# Patient Record
Sex: Male | Born: 1955
Health system: Southern US, Community
[De-identification: ages and names within clinical notes are randomized; demographics above are authoritative.]

## PROBLEM LIST (undated history)

## (undated) DIAGNOSIS — J45909 Unspecified asthma, uncomplicated: Secondary | ICD-10-CM

## (undated) DIAGNOSIS — K5790 Diverticulosis of intestine, part unspecified, without perforation or abscess without bleeding: Secondary | ICD-10-CM

## (undated) DIAGNOSIS — I10 Essential (primary) hypertension: Secondary | ICD-10-CM

## (undated) DIAGNOSIS — E7800 Pure hypercholesterolemia, unspecified: Secondary | ICD-10-CM

## (undated) DIAGNOSIS — F988 Other specified behavioral and emotional disorders with onset usually occurring in childhood and adolescence: Secondary | ICD-10-CM

## (undated) HISTORY — PX: EYE SURGERY: SHX253

---

## 2007-08-15 ENCOUNTER — Encounter: Admission: RE | Admit: 2007-08-15 | Discharge: 2007-08-15 | Payer: Self-pay | Admitting: Otolaryngology

## 2007-10-09 ENCOUNTER — Encounter: Admission: RE | Admit: 2007-10-09 | Discharge: 2007-10-09 | Payer: Self-pay | Admitting: Otolaryngology

## 2007-10-16 ENCOUNTER — Encounter (INDEPENDENT_AMBULATORY_CARE_PROVIDER_SITE_OTHER): Payer: Self-pay | Admitting: Otolaryngology

## 2007-10-16 ENCOUNTER — Ambulatory Visit (HOSPITAL_BASED_OUTPATIENT_CLINIC_OR_DEPARTMENT_OTHER): Admission: RE | Admit: 2007-10-16 | Discharge: 2007-10-16 | Payer: Self-pay | Admitting: Otolaryngology

## 2009-03-07 IMAGING — CR DG CHEST 2V
3 series · 3 of 3 positions shown · non-contrast
Comparison: 08/15/07.

CLINICAL DATA: Pre-op.  Productive cough for several weeks. 
 CHEST ? 2 VIEW:

[view not recorded (1 of 3)]
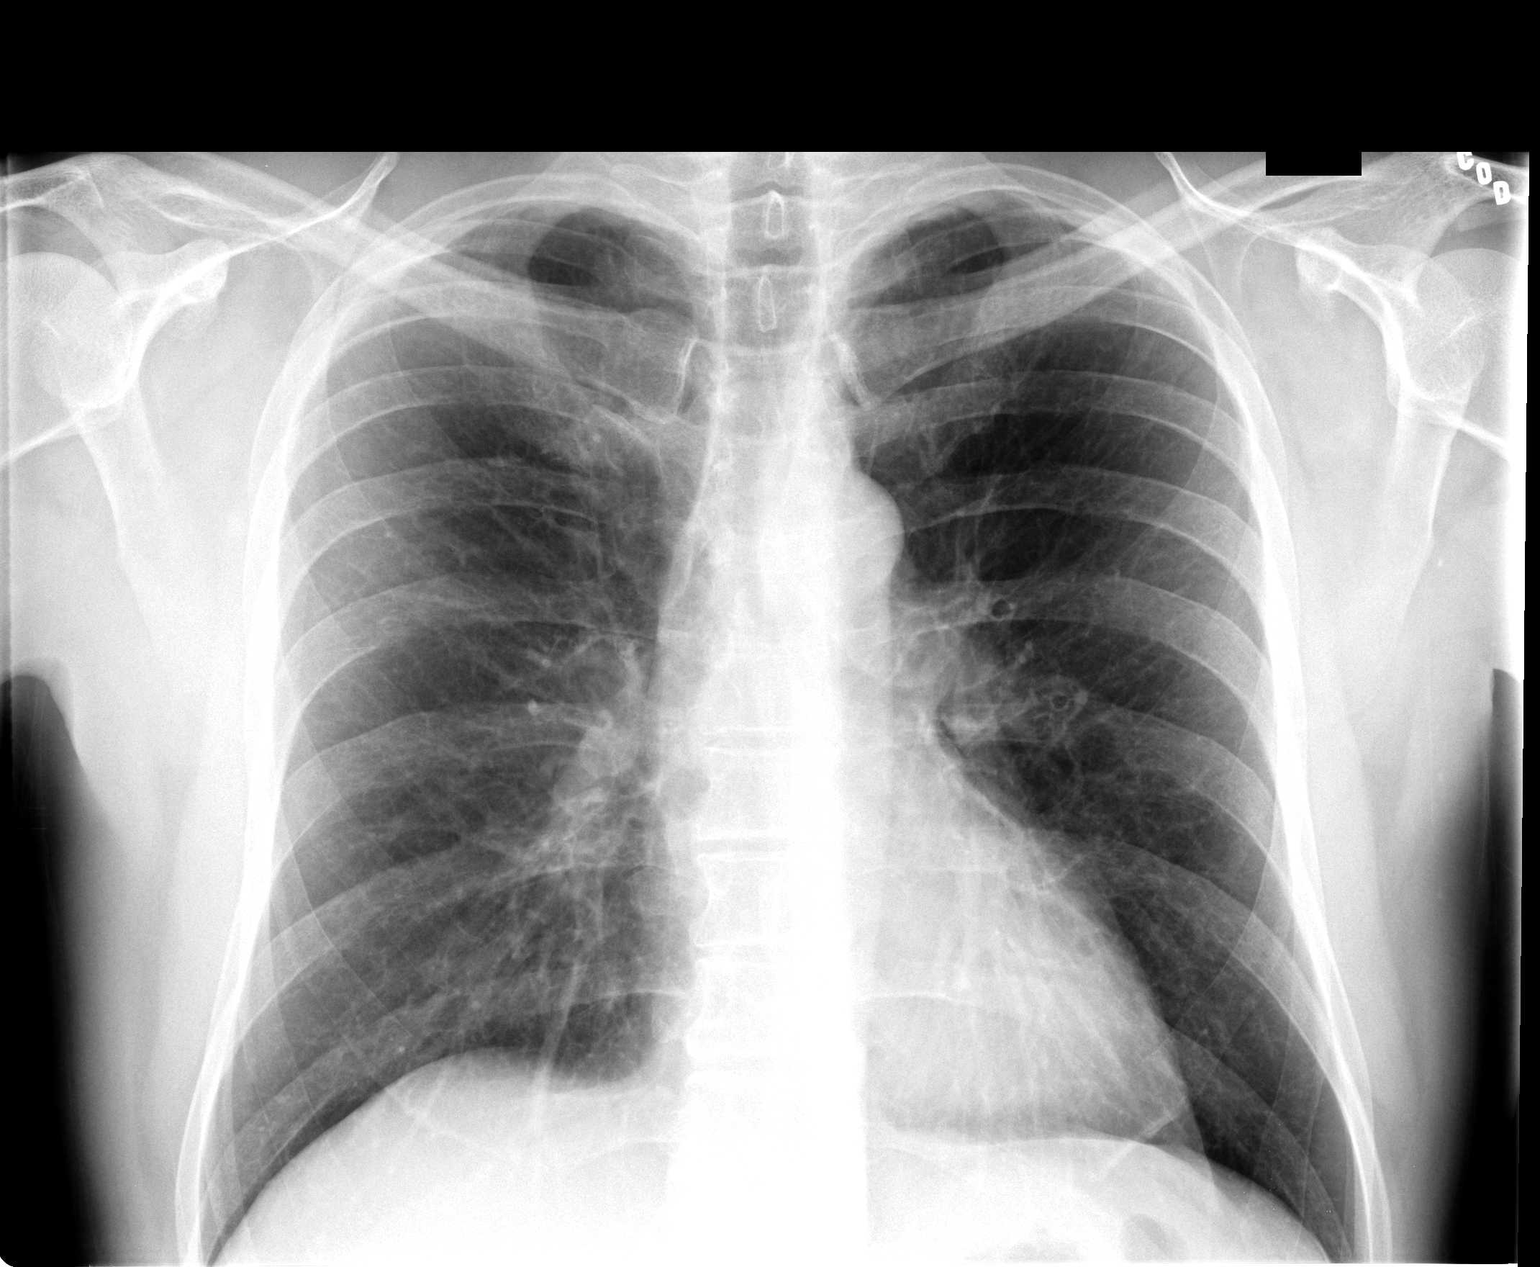

[view not recorded (2 of 3)]
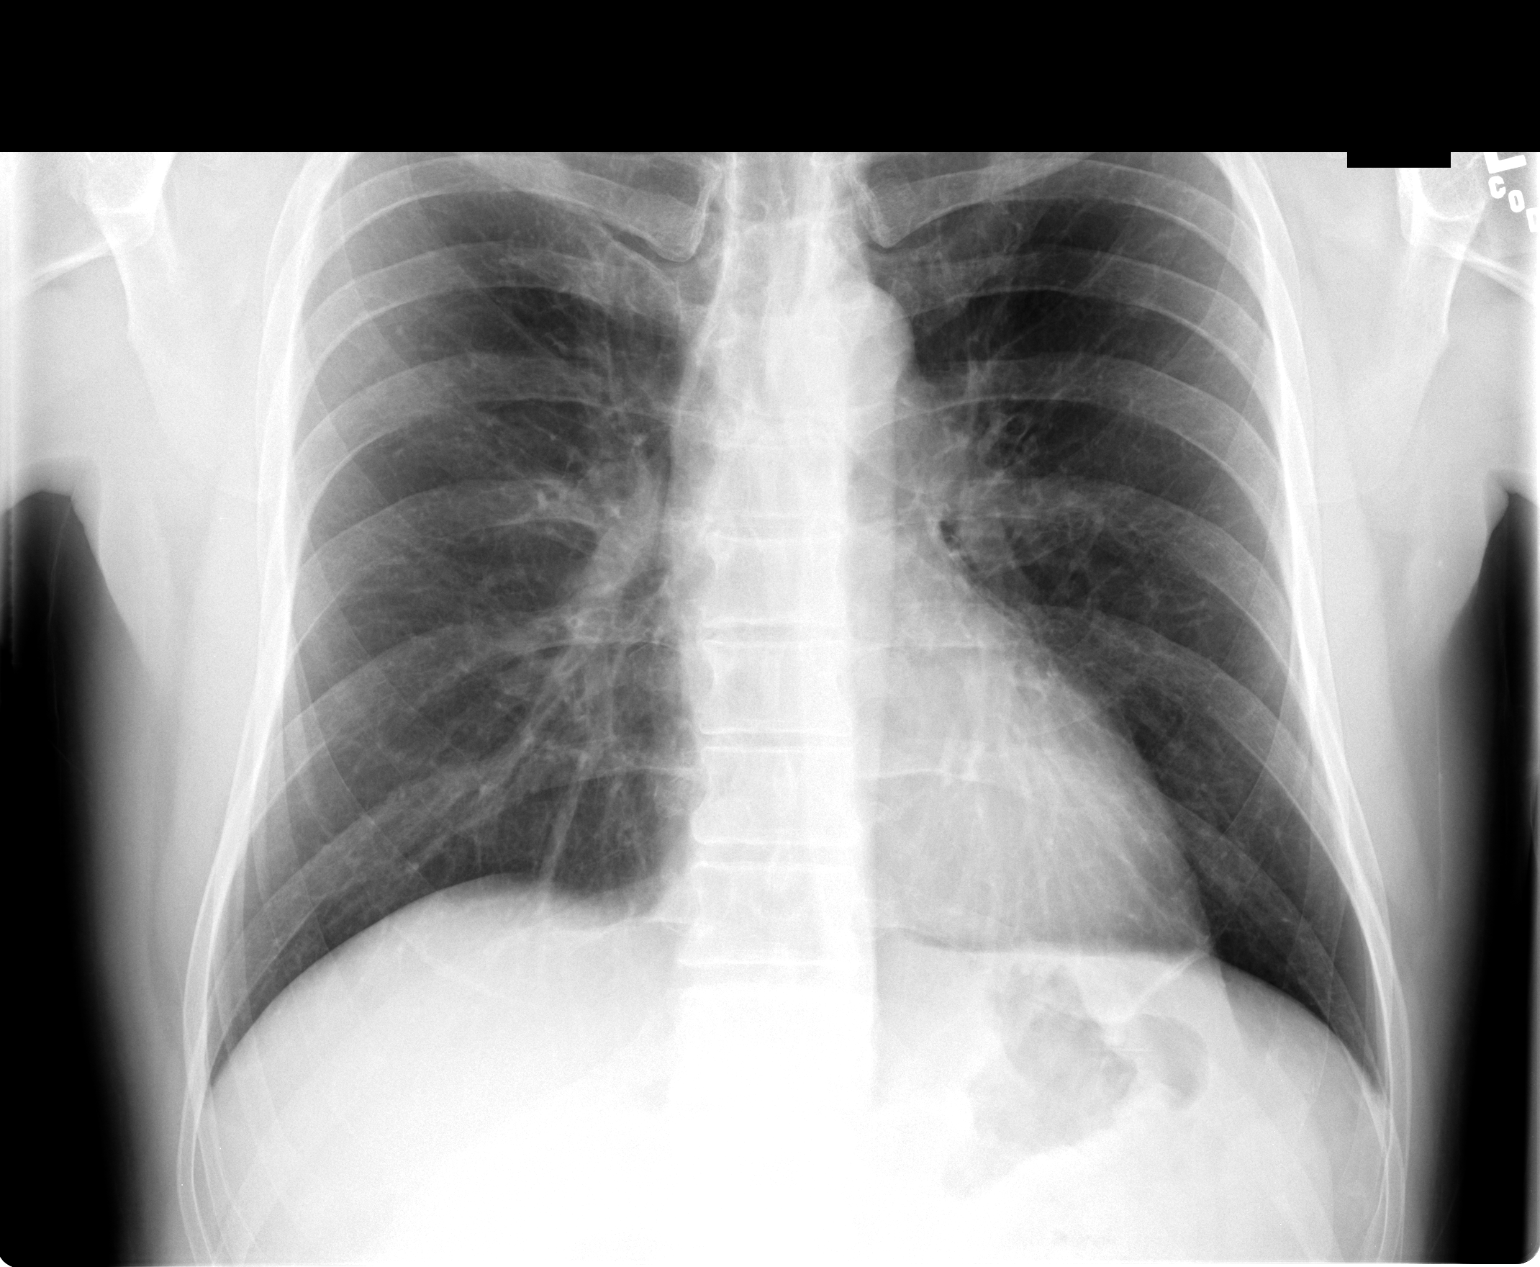

[view not recorded (3 of 3)]
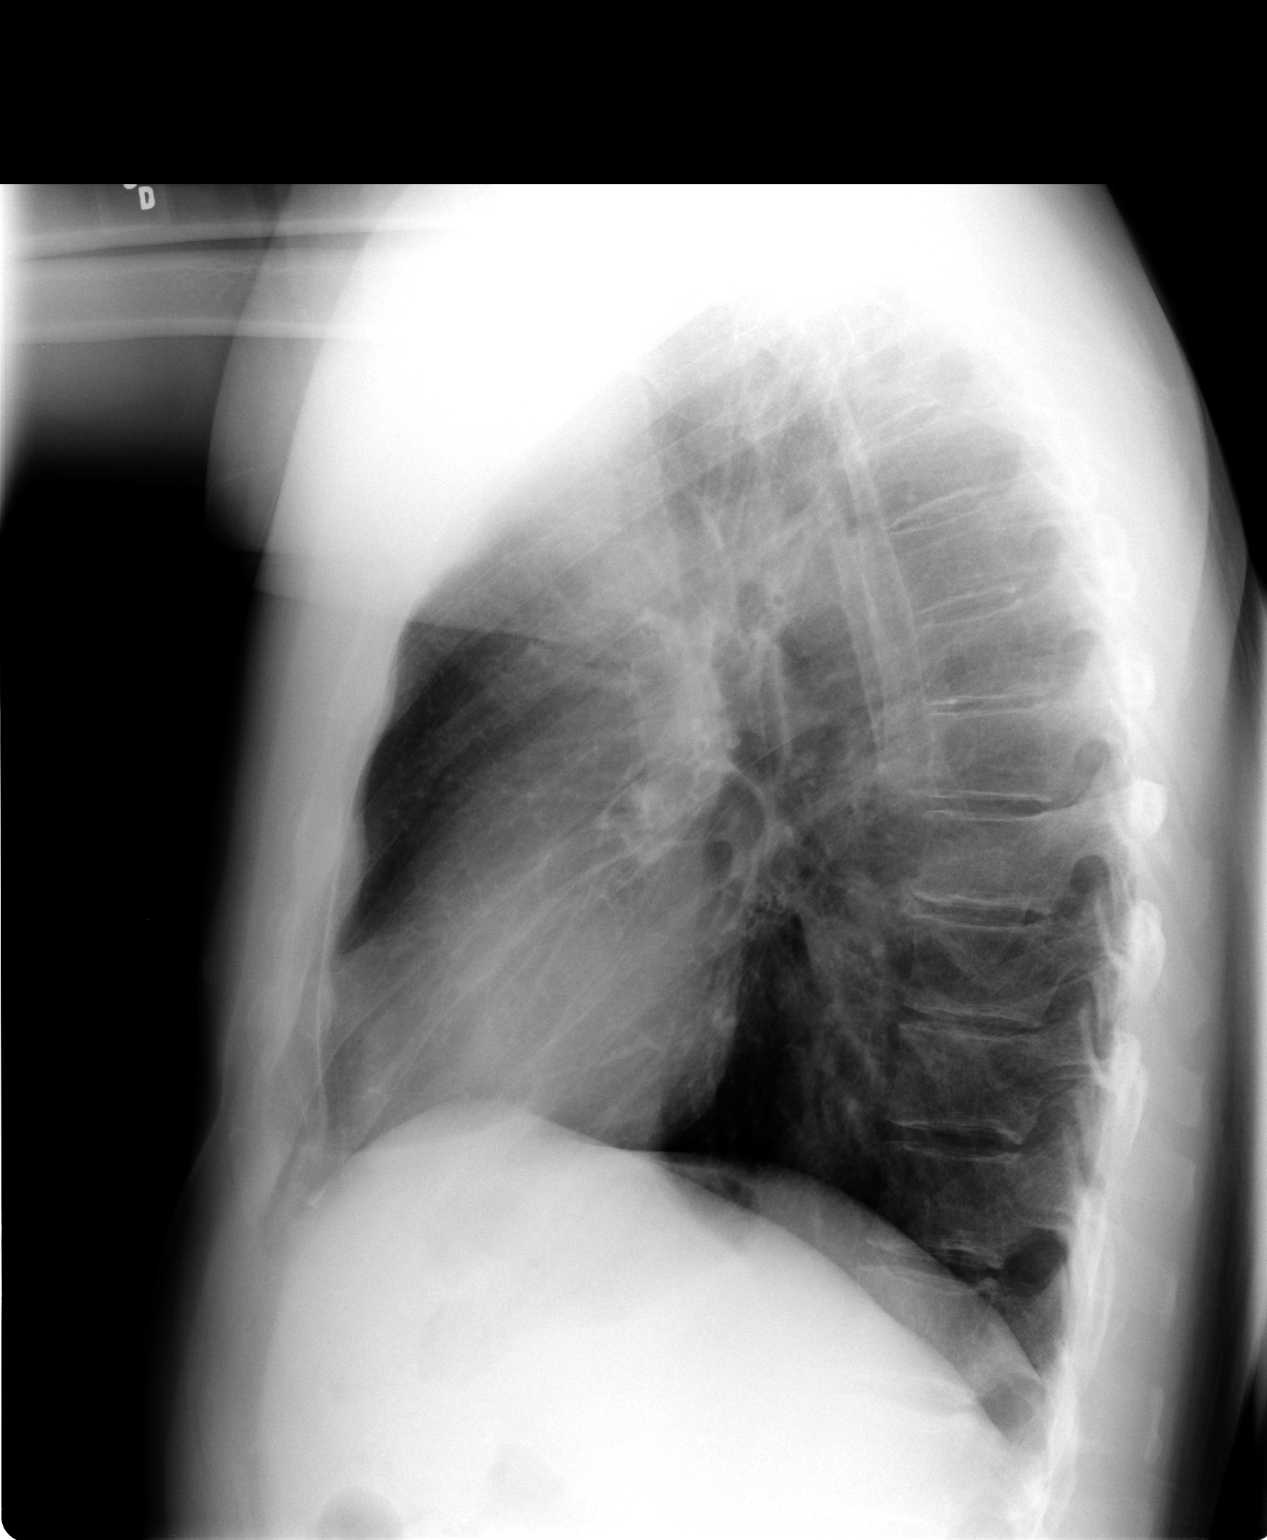

[3 of 3 positions shown; findings below may reference images not displayed]

FINDINGS: Trachea is midline.  Heart size stable.  Lungs are clear.  No pleural fluid.
IMPRESSION: No acute findings.

## 2010-07-01 ENCOUNTER — Ambulatory Visit: Payer: Self-pay | Admitting: Cardiology

## 2010-07-01 LAB — CONVERTED CEMR LAB
AST: 29 units/L (ref 0–37)
Albumin: 4.2 g/dL (ref 3.5–5.2)
Alkaline Phosphatase: 50 units/L (ref 39–117)
BUN: 27 mg/dL — ABNORMAL HIGH (ref 6–23)
Basophils Absolute: 0 10*3/uL (ref 0.0–0.1)
Calcium: 9.5 mg/dL (ref 8.4–10.5)
Chloride: 101 meq/L (ref 96–112)
Cholesterol: 137 mg/dL (ref 0–200)
Eosinophils Relative: 6.8 % — ABNORMAL HIGH (ref 0.0–5.0)
HCT: 39.5 % (ref 39.0–52.0)
HDL: 35.9 mg/dL — ABNORMAL LOW (ref >39.00)
Hemoglobin: 13.7 g/dL (ref 13.0–17.0)
Lymphocytes Relative: 16.9 % (ref 12.0–46.0)
Lymphs Abs: 1.2 10*3/uL (ref 0.7–4.0)
MCV: 90.3 fL (ref 78.0–100.0)
Monocytes Relative: 7.9 % (ref 3.0–12.0)
Neutro Abs: 4.6 10*3/uL (ref 1.4–7.7)
Potassium: 3.9 meq/L (ref 3.5–5.1)
RBC: 4.38 M/uL (ref 4.22–5.81)
Sodium: 140 meq/L (ref 135–145)
Total Bilirubin: 0.9 mg/dL (ref 0.3–1.2)
Total Protein: 6.4 g/dL (ref 6.0–8.3)
Triglycerides: 186 mg/dL — ABNORMAL HIGH (ref 0.0–149.0)
VLDL: 37.2 mg/dL (ref 0.0–40.0)

## 2010-07-02 ENCOUNTER — Ambulatory Visit: Payer: Self-pay | Admitting: Cardiology

## 2010-07-02 DIAGNOSIS — R0602 Shortness of breath: Secondary | ICD-10-CM | POA: Insufficient documentation

## 2010-07-02 DIAGNOSIS — E785 Hyperlipidemia, unspecified: Secondary | ICD-10-CM | POA: Insufficient documentation

## 2010-07-02 DIAGNOSIS — I10 Essential (primary) hypertension: Secondary | ICD-10-CM | POA: Insufficient documentation

## 2010-07-02 LAB — CONVERTED CEMR LAB: TSH: 0.88 microintl units/mL (ref 0.35–5.50)

## 2010-07-08 ENCOUNTER — Telehealth (INDEPENDENT_AMBULATORY_CARE_PROVIDER_SITE_OTHER): Payer: Self-pay | Admitting: *Deleted

## 2010-07-09 ENCOUNTER — Ambulatory Visit: Payer: Self-pay | Admitting: Internal Medicine

## 2010-07-09 ENCOUNTER — Encounter: Payer: Self-pay | Admitting: Internal Medicine

## 2010-07-09 ENCOUNTER — Ambulatory Visit: Payer: Self-pay

## 2010-07-09 ENCOUNTER — Encounter (HOSPITAL_COMMUNITY): Admission: RE | Admit: 2010-07-09 | Discharge: 2010-08-11 | Payer: Self-pay | Admitting: Cardiology

## 2010-12-24 NOTE — Assessment & Plan Note (Signed)
Summary: Cardiology Nuclear Testing  Nuclear Med Background Indications for Stress Test: Evaluation for Ischemia   History: Myocardial Perfusion Study  History Comments:  ~15 yrs ago MPS:OK per patient  Symptoms: Chest Pressure with Exertion, Diaphoresis, Dizziness, DOE, Fatigue, Light-Headedness, Palpitations  Symptoms Comments: Last episode of CP:1 week ago.   Nuclear Pre-Procedure Cardiac Risk Factors: Hypertension, Lipids Caffeine/Decaff Intake: None NPO After: 6:30 PM Lungs: Clear IV 0.9% NS with Angio Cath: 20g     IV Site: (R) AC IV Started by: Irean Hong RN Chest Size (in) 42     Height (in): 71 Weight (lb): 192 BMI: 26.88  Nuclear Med Study 1 or 2 day study:  1 day     Stress Test Type:  Stress Reading MD:  Arvilla Meres, MD     Referring MD:  Charlies Constable, MD Resting Radionuclide:  Technetium 24m Tetrofosmin     Resting Radionuclide Dose:  11 mCi  Stress Radionuclide:  Technetium 39m Tetrofosmin     Stress Radionuclide Dose:  33 mCi   Stress Protocol Exercise Time (min):  11:00 min     Max HR:  146 bpm     Predicted Max HR:  168 bpm  Max Systolic BP: 175 mm Hg     Percent Max HR:  86.90 %     METS: 13.4 Rate Pressure Product:  16109    Stress Test Technologist:  [employer] CMA-N     Nuclear Technologist:  Domenic Polite CNMT  Rest Procedure  Myocardial perfusion imaging was performed at rest 45 minutes following the intravenous administration of Myoview Technetium 11m Tetrofosmin.  Stress Procedure  The patient exercised for eleven minutes.  The patient stopped due to fatigue and denied any chest pain.  There were no significant ST-T wave changes, only occasional fusion beats, PVC's and PAC's.  Myoview was injected at peak exercise and myocardial perfusion imaging was performed after a brief delay.  QPS Raw Data Images:  Normal; no motion artifact; normal heart/lung ratio. Stress Images:  There is normal uptake in all areas. Rest Images:  Normal  homogeneous uptake in all areas of the myocardium. Subtraction (SDS):  Normal Transient Ischemic Dilatation:  .95  (Normal <1.22)  Lung/Heart Ratio:  .32  (Normal <0.45)  Quantitative Gated Spect Images QGS EDV:  94 ml QGS ESV:  38 ml QGS EF:  60 % QGS cine images:  Normal  Findings Normal nuclear study      Overall Impression  Exercise Capacity: Excellent exercise capacity. BP Response: Normal blood pressure response. Clinical Symptoms: Dyspnea ECG Impression: No significant ST segment change suggestive of ischemia. Overall Impression: Normal stress nuclear study.  Appended Document: Cardiology Nuclear Testing I called. BB

## 2010-12-24 NOTE — Assessment & Plan Note (Signed)
Summary: np6   Visit Type:  Initial Consult Primary Provider:  [facility]   History of Present Illness: Jimmy Santos is 55 years old and came in today for a new patient evaluation because of shortness of breath. These symptoms have been going on for about 3 months. He notices the shortness of breath mostly with activity and it is associated with some back and mild chest discomfort. After he finishes the activity he has a profound sense of fatigue. Most of his activities that cause the symptoms are tenderness.  His risk profile for vascular disease include hypertension and hyperlipidemia.  He also has a history of sinus infections and has had previous sinus surgery. He wondered if some of his symptoms could be related to asthma although he has had no recent wheezing.  Current Medications (verified): 1)  Lipitor 10 Mg Tabs (Atorvastatin Calcium) .Marland Kitchen.. 1 By Mouth Daily 2)  Adderall 10 Mg Tabs (Amphetamine-Dextroamphetamine) .... 2 By Mouth Two Times A Day 3)  Quinapril-Hydrochlorothiazide 20-25 Mg Tabs (Quinapril-Hydrochlorothiazide) .Marland Kitchen.. 1 By Mouth Daily 4)  Multivitamins   Tabs (Multiple Vitamin) .Marland Kitchen.. 1 By Mouth Daily  Allergies (verified): No Known Drug Allergies  Past History:  Past Medical History: Sinus surgery Hypertension Hyperlipidemia  Family History: Father has hypertension and is alive. Mother has no history of heart disease.  Social History: He is married and has one son who graduated from [employer] and is working with [employer]. 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 He does not smoke  Review of Systems       ROS is negative except as outlined in HPI.   Vital Signs:  Patient profile:   55 year old male Height:      71 inches Weight:      194 pounds BMI:     27.16 Pulse rate:   63 / minute BP sitting:   140 / 80  (left arm) Cuff size:   regular  Vitals Entered By: Burnett Kanaris, CNA (July 02, 2010 12:50 PM)  Physical Exam  Additional  Exam:  Gen. Well-nourished, in no distress Head: Normocephalic    Eyes: PERRLA/EOM intact; conjunctiva and lids normal Neck: No JVD, thyroid not enlarged, no carotid bruits Lungs: No tachypnea, clear w/o rales, rhonchi or wheezes CV: Rhythm regular, PMI not displaced,  sounds  nl, no murmurs or gallops, no edema, pulses nl UE and LE Abd: BS nl, abd soft and non-tender w/o masses or hepatosplenomegaly MS: No deformities Neuro: no focal sns Skin: no lesions Psych: nl affect    Impression & Recommendations:  Problem # 1:  SHORTNESS OF BREATH (ICD-786.05)  The etiology of his shortness of breath is not clear. He has no history of lung disease and has not been a smoker. He does have some associated chest pain and he does have a moderate risk profile for vascular disease. I'm concerned his symptoms could be due to ischemia. His left card and today is normal with one PVC. We will plan to get a chest x-ray and plan to schedule a rest stress exercise Myoview scan.  His updated medication list for this problem includes:    Quinapril-hydrochlorothiazide 20-25 Mg Tabs (Quinapril-hydrochlorothiazide) .Marland Kitchen... 1 by mouth daily  Orders: EKG w/ Interpretation (93000) Nuclear Stress Test (Nuc Stress Test) T-2 View CXR (71020TC)  Problem # 2:  HYPERTENSION, BENIGN (ICD-401.1) This appears well controlled on his current medications. He indicated his blood pressures at home her lower than what was measured today. His  updated medication list for this problem includes:    Quinapril-hydrochlorothiazide 20-25 Mg Tabs (Quinapril-hydrochlorothiazide) .Marland Kitchen... 1 by mouth daily  Problem # 3:  HYPERLIPIDEMIA-MIXED (ICD-272.4) This appears fairly well controlled on current medications although his HDL is slightly low. His updated medication list for this problem includes:    Lipitor 10 Mg Tabs (Atorvastatin calcium) .Marland Kitchen... 1 by mouth daily  Patient Instructions: 1)  A chest x-ray takes a picture of the organs and  structures inside the chest, including the heart, lungs, and blood vessels. This test can show several things, including, whether the heart is enlarged; whether fluid is building up in the lungs; and whether pacemaker / defibrillator leads are still in place. 2)  Your physician has requested that you have an exercise stress myoview.  For further information please visit [email or web address]  Please follow instruction sheet, as given. 3)  We will see you back on an as needed basis.

## 2010-12-24 NOTE — Progress Notes (Signed)
Summary: Nuclear pre procedure  Phone Note Outgoing Call Call back at [facility] Phone [phone]   Call placed by: Rea [employer], CMA,  July 08, 2010 5:03 PM Call placed to: Patient Summary of Call: Annice Pih left message with information on Myoview Information Sheet (see scanned document for details).      Nuclear Med Background Indications for Stress Test: Evaluation for Ischemia     Symptoms: Chest Pain, DOE, SOB    Nuclear Pre-Procedure Cardiac Risk Factors: Hypertension, Lipids Height (in): 71

## 2011-04-06 NOTE — Op Note (Signed)
NAME:  Jimmy Santos, Jimmy Santos             ACCOUNT NO.:  [id number]   MEDICAL RECORD NO.:  [id number]          PATIENT TYPE:  AMB   LOCATION:  DSC                          FACILITY:  [facility]   PHYSICIAN:  Kinnie Scales. Annalee Genta, M.D.DATE OF BIRTH:  12-Jan-1956   DATE OF PROCEDURE:  10/16/2007  DATE OF DISCHARGE:                               OPERATIVE REPORT   PREOPERATIVE DIAGNOSES:  1. Chronic sinusitis.  2. Sinonasal polyps.  3. Progressive reactive airway disease.  4. Nasal septal deviation with airway obstruction.  5. Inferior turbinate hypertrophy.   POSTOPERATIVE DIAGNOSIS/INDICATIONS FOR PROCEDURE:  1. Chronic sinusitis.  2. Sinonasal polyps.  3. Progressive reactive airway disease.  4. Nasal septal deviation with airway obstruction.  5. Inferior turbinate hypertrophy.   SURGICAL PROCEDURES:  1. Bilateral endoscopic sinus surgery with computer-assisted      navigation (InstaTrak consisting of bilateral total ethmoidectomy,      bilateral maxillary antrostomy with removal of diseased tissue and      bilateral nasal frontal recess exploration).  2. Nasal septoplasty.  3. Bilateral inferior turbinate reduction.   ANESTHESIA:  General endotracheal.   SURGEON:  Dr. Annalee Genta.   COMPLICATIONS:  None.   ESTIMATED BLOOD LOSS:  Approximately 300 mL.   The patient transferred from the operating room to the recovery room in  stable condition.   BRIEF HISTORY:  Jimmy Santos is a 55 year old white male whose been  followed by Dr. Flo Shanks at [facility] with a history of  progressive chronic sinusitis. The patient has been treated with  multiple rounds of long-term high potency antibiotic therapy and despite  this aggressive treatment, the patient has continued to have chronic and  recurrent sinusitis.  He has had progression in his reactive airway  disease with difficulty managing his asthmatic flares.  The patient  complains of chronic and progressive nasal airway congestion  and  obstruction, bilateral facial pressure and chronic postnasal discharge  with cough. Despite appropriate medical therapy, the patient has  continued to have ongoing and worsening symptoms.  CT scanning performed  as part of the evaluation and workup showed significant polypoid disease  involving the ethmoid, maxillary and frontal sinuses.  The patient also  had a severely deviated nasal septum and turbinate hypertrophy.  Given  the patient's history, examination and physical findings, he was  scheduled for the above surgical procedures. Prior to surgery, a CT scan  was obtained with InstaTrak formatting for intraoperative computer-  assisted navigation.  The patient was also treated with a prolonged  course of combined antibiotic therapy and oral steroids and despite this  treatment continued to have ongoing problems. Given his history and  examination, he was counseled regarding the risks, benefits and possible  complications of the above surgical procedures.  The patient and his  wife understood and concurred with our plan for surgery which was  scheduled as an outpatient under general anesthesia.  Unfortunately, Dr.  Lazarus Salines was injured prior to the scheduled surgery and was unable to  perform the surgery. At the patient's request, Dr. Osborn Coho was  able to perform the surgical procedure under  general anesthesia at [facility]. Again, prior to surgery, the risks,  benefits and possible complications of each of the surgical procedures  were discussed in detail and the patient and his wife understood and  concurred with our plan for surgery which was scheduled as above.   DESCRIPTION OF PROCEDURE:  The patient was brought to the operating room  on October 16, 2007 and placed in a supine position on the operating  table.  General endotracheal anesthesia was established. When the  patient was adequately anesthetized, the patient's nasal cavity was   injected with a total of 9 mL of 1% lidocaine 1:100,000 solution  epinephrine which was injected in a submucosal fashion along the middle  turbinate, uncinate process, lateral nasal wall, inferior turbinate and  nasal septum.  The patient was also injected via transoral  sphenopalatine block. A total of 9 mL of local was used. The patient's  nose was then packed with Afrin-soaked cottonoid pledgets left in place  for approximately 10 minutes to allow for vasoconstriction and  hemostasis.  The InstaTrak computer navigation headgear was applied,  anatomic and surgical landmarks were identified and confirmed. This  device was used throughout the surgical procedure for intraoperative  computer-assisted navigation. The patient was positioned on the  operating table, prepped and draped in a sterile fashion.   The patient's nasal cavity was examined using a zero-degree telescope  and the surgical procedure was begun on the left-hand side  after  removal of packing. Using a zero-degree telescope and the straight  microdebrider, the patient's nasal cavities cleared of minimal polypoid  disease within the middle meatus.  The middle turbinate was gently  medialized and the uncinate process reflected anteriorly and resected  with a through cutting forceps.  The entire uncinate process then  resected and dissection was carried out through the ethmoid bulla and  dissected from anterior to posterior through the ethmoid cavity.  The  patient had significant polypoid disease in the left ethmoid region and  this was resected using the straight microdebrider. The posterior  ethmoid air cells were identified and location was confirmed using the  InstaTrak device. Using a 45 degree telescope and a curved  microdebrider, dissection was carried out from posterior to anterior  along the roof of the ethmoid removing polypoid disease and bony  septations. The patient had a very large underlying anterior ethmoid  air  cell with significant polypoid inflammation. This was resected and  dissection was then carried out into the nasofrontal recess. The patient  was found to have a tripartite nasofrontal recess and the individual  septations were resected.  Thick mucopurulent material was aspirated  from the frontal sinus creating a widely patent nasofrontal recess.  Attention was then turned to the lateral nasal wall where the natural  ostia of the maxillary sinus was identified.  This completely occluded  with polypoid disease which was resected. Using a 45 degree telescope,  the natural ostium was enlarged in an anterior, inferior and posterior  direction.  A large posterior accessory ostium was identified and the  intervening soft tissue was resected. Within the left maxillary sinus,  there was significant mucinous inflammation and polypoid mucosa which  was resected.  The entire sinus was cleared of infected material and the  sinus ostium was patent at the conclusion of procedure.   Attention was then turned to the patient's right-hand side where a  similar procedure was carried out. The middle turbinate was  gently  medialized.  The uncinate process was resected and a zero-degree  telescope with a microdebrider was used to clear the inferior aspects of  the ethmoid sinus of disease. The posterior superior ethmoid was  identified and this was confirmed with the InstaTrak. Dissection was  then carried along the roof of the ethmoid sinus removing bony  septations and polypoid mucosa with a 45 degree telescope and a curved  microdebrider.  Again the nasofrontal recess was completely occluded  with polypoid disease and this was cleared using the 60 degree  microdebrider and a 45 degree telescope for visualization with the  InstaTrak guidance. Attention was then to the turned lateral nasal wall  where the natural ostium was identified and enlarged again there was  thick mucinous debris within the  maxillary sinus and a culture was taken  and sent to pathology for evaluation of allergic fungal mucin. Culture  and sensitivity was taken from the left maxillary sinus, allergic fungal  mucin from the right maxillary sinus. The patient's nasal cavity was  then cleared of debris and suctioned.  Attention was then turned to the  nasal septum where nasal septoplasty was performed. A right anterior  hemitransfixion incision was created and a mucoperichondrial flap was  elevated from anterior to posterior. On the patient's right-hand side,  there was a large right-sided bony septal spur which was mobilized with  a 4-mm osteotome and resected. The bony cartilaginous junction was then  crossed in the midline and mucoperiosteal flap was elevated along the  left-hand side.  Deviated bone and cartilage was resected, anterior,  dorsal and columellar cartilage was not deviated and was left intact.  Mid septal cartilage was resected, morselized and returned to the  mucoperichondrial pocket. Flaps were reapproximated with a 4-0 gut  suture on a Keith needle in a horizontal mattress fashion and the  anterior hemitransfixion incision was closed with the same stitch. At  the conclusion of the procedure, bilateral Doyle nasal septal splints  were placed after the application of Bactroban ointment and sutured in  position with a 3-0 Ethilon suture.   Inferior turbinate reduction was then performed with bipolar cautery set  at 12 watts. Two submucosal passes were made in each inferior turbinate.  When the turbinates had been adequately cauterized, an anterior incision  was created in each inferior turbinate. A small amount of turbinate bone  was then resected preserving the overlying mucosa. Turbinates were then  outfractured to create a more patent nasal cavity.   The patient's nasal cavity was then inspected with a zero degree  telescope, cleared of all blood and surgical debris. A slurry consisting  of  50% of Kenalog 40 and Bactroban cream was then instilled within the  frontal ethmoid and maxillary sinus bilaterally.  Bilateral Kennedy  sinus packs were then placed within the common ethmoid cavity and  hydrated with saline.  The patient's oral cavity and oropharynx were  irrigated and suctioned.  An orogastric tube was passed and stomach  contents were aspirated.  The patient was awakened from his anesthetic,  extubated and was transferred from the operating room to the recovery  room in stable condition. There were no complications.  Blood loss  approximately 300 mL.           ______________________________  Kinnie Scales. Annalee Genta, M.D.     DLS/MEDQ  D:  78/29/5621  T:  10/16/2007  Job:  [id number]

## 2011-08-30 LAB — FUNGUS CULTURE W SMEAR: Fungal Smear: NONE SEEN

## 2011-08-30 LAB — CULTURE, ROUTINE-SINUS: Culture: NO GROWTH

## 2011-08-31 LAB — BASIC METABOLIC PANEL
BUN: 22
CO2: 29
Creatinine, Ser: 1.32
GFR calc Af Amer: >60
GFR calc non Af Amer: 58 — ABNORMAL LOW
Glucose, Bld: 133 — ABNORMAL HIGH

## 2011-08-31 LAB — POCT HEMOGLOBIN-HEMACUE
Hemoglobin: 15.2
Operator id: 208731

## 2012-03-08 ENCOUNTER — Other Ambulatory Visit: Payer: Self-pay | Admitting: Gastroenterology

## 2012-11-22 HISTORY — PX: COLONOSCOPY: SHX174

## 2013-11-22 HISTORY — PX: RETINAL DETACHMENT SURGERY: SHX105

## 2014-02-11 ENCOUNTER — Encounter (INDEPENDENT_AMBULATORY_CARE_PROVIDER_SITE_OTHER): Payer: 59 | Admitting: Ophthalmology

## 2014-02-11 DIAGNOSIS — H33309 Unspecified retinal break, unspecified eye: Secondary | ICD-10-CM

## 2014-02-11 DIAGNOSIS — I10 Essential (primary) hypertension: Secondary | ICD-10-CM

## 2014-02-11 DIAGNOSIS — H43819 Vitreous degeneration, unspecified eye: Secondary | ICD-10-CM

## 2014-02-11 DIAGNOSIS — H4310 Vitreous hemorrhage, unspecified eye: Secondary | ICD-10-CM

## 2014-02-11 DIAGNOSIS — H35039 Hypertensive retinopathy, unspecified eye: Secondary | ICD-10-CM

## 2014-02-18 ENCOUNTER — Ambulatory Visit (INDEPENDENT_AMBULATORY_CARE_PROVIDER_SITE_OTHER): Payer: 59 | Admitting: Ophthalmology

## 2014-02-18 DIAGNOSIS — H33309 Unspecified retinal break, unspecified eye: Secondary | ICD-10-CM

## 2014-06-08 ENCOUNTER — Emergency Department (HOSPITAL_COMMUNITY): Payer: 59

## 2014-06-08 ENCOUNTER — Emergency Department (HOSPITAL_COMMUNITY)
Admission: EM | Admit: 2014-06-08 | Discharge: 2014-06-08 | Disposition: A | Discharge status: Home / Self Care | Payer: 59 | Attending: Emergency Medicine | Admitting: Emergency Medicine

## 2014-06-08 ENCOUNTER — Encounter (HOSPITAL_COMMUNITY): Payer: Self-pay | Admitting: Emergency Medicine

## 2014-06-08 DIAGNOSIS — Z79899 Other long term (current) drug therapy: Secondary | ICD-10-CM | POA: Insufficient documentation

## 2014-06-08 DIAGNOSIS — K5730 Diverticulosis of large intestine without perforation or abscess without bleeding: Secondary | ICD-10-CM | POA: Insufficient documentation

## 2014-06-08 DIAGNOSIS — J45909 Unspecified asthma, uncomplicated: Secondary | ICD-10-CM | POA: Insufficient documentation

## 2014-06-08 DIAGNOSIS — N200 Calculus of kidney: Secondary | ICD-10-CM

## 2014-06-08 HISTORY — DX: Essential (primary) hypertension: I10

## 2014-06-08 HISTORY — DX: Unspecified asthma, uncomplicated: J45.909

## 2014-06-08 HISTORY — DX: Diverticulosis of intestine, part unspecified, without perforation or abscess without bleeding: K57.90

## 2014-06-08 LAB — CBC WITH DIFFERENTIAL/PLATELET
Basophils Absolute: 0 10*3/uL (ref 0.0–0.1)
Basophils Relative: 0 % (ref 0–1)
Eosinophils Absolute: 0.2 10*3/uL (ref 0.0–0.7)
Eosinophils Relative: 2 % (ref 0–5)
HCT: 41.7 % (ref 39.0–52.0)
Hemoglobin: 14.3 g/dL (ref 13.0–17.0)
Lymphocytes Relative: 7 % — ABNORMAL LOW (ref 12–46)
Lymphs Abs: 0.6 10*3/uL — ABNORMAL LOW (ref 0.7–4.0)
MCH: 30.6 pg (ref 26.0–34.0)
MCHC: 34.3 g/dL (ref 30.0–36.0)
MCV: 89.1 fL (ref 78.0–100.0)
Monocytes Absolute: 1.2 10*3/uL — ABNORMAL HIGH (ref 0.1–1.0)
Monocytes Relative: 13 % — ABNORMAL HIGH (ref 3–12)
Neutro Abs: 7.1 10*3/uL (ref 1.7–7.7)
Neutrophils Relative %: 78 % — ABNORMAL HIGH (ref 43–77)
Platelets: 155 10*3/uL (ref 150–400)
RBC: 4.68 MIL/uL (ref 4.22–5.81)
RDW: 13 % (ref 11.5–15.5)
WBC: 9.2 10*3/uL (ref 4.0–10.5)

## 2014-06-08 LAB — COMPREHENSIVE METABOLIC PANEL
ALT: 28 U/L (ref 0–53)
AST: 23 U/L (ref 0–37)
Albumin: 3.8 g/dL (ref 3.5–5.2)
Alkaline Phosphatase: 59 U/L (ref 39–117)
Anion gap: 17 — ABNORMAL HIGH (ref 5–15)
BUN: 18 mg/dL (ref 6–23)
CO2: 27 mEq/L (ref 19–32)
Calcium: 9.8 mg/dL (ref 8.4–10.5)
Chloride: 95 mEq/L — ABNORMAL LOW (ref 96–112)
Creatinine, Ser: 1.59 mg/dL — ABNORMAL HIGH (ref 0.50–1.35)
GFR calc Af Amer: 54 mL/min — ABNORMAL LOW (ref >90)
GFR calc non Af Amer: 47 mL/min — ABNORMAL LOW (ref >90)
Glucose, Bld: 108 mg/dL — ABNORMAL HIGH (ref 70–99)
Potassium: 3.5 mEq/L — ABNORMAL LOW (ref 3.7–5.3)
Sodium: 139 mEq/L (ref 137–147)
Total Bilirubin: 0.8 mg/dL (ref 0.3–1.2)
Total Protein: 7.4 g/dL (ref 6.0–8.3)

## 2014-06-08 LAB — URINALYSIS, ROUTINE W REFLEX MICROSCOPIC
Bilirubin Urine: NEGATIVE
Glucose, UA: NEGATIVE mg/dL
Hgb urine dipstick: NEGATIVE
Ketones, ur: 15 mg/dL — AB
Nitrite: NEGATIVE
Protein, ur: NEGATIVE mg/dL
Specific Gravity, Urine: 1.025 (ref 1.005–1.030)
Urobilinogen, UA: 0.2 mg/dL (ref 0.0–1.0)
pH: 5.5 (ref 5.0–8.0)

## 2014-06-08 LAB — LACTIC ACID, PLASMA: Lactic Acid, Venous: 1 mmol/L (ref 0.5–2.2)

## 2014-06-08 LAB — URINE MICROSCOPIC-ADD ON

## 2014-06-08 MED ORDER — IOHEXOL 350 MG/ML SOLN
100.0000 mL | Freq: Once | INTRAVENOUS | Status: AC | PRN
  Administered 2014-06-08: 100 mL via INTRAVENOUS

## 2014-06-08 MED ORDER — SODIUM CHLORIDE 0.9 % IV BOLUS (SEPSIS)
1000.0000 mL | Freq: Once | INTRAVENOUS | Status: AC
  Administered 2014-06-08: 1000 mL via INTRAVENOUS

## 2014-06-08 MED ORDER — ONDANSETRON HCL 4 MG/2ML IJ SOLN
4.0000 mg | Freq: Once | INTRAMUSCULAR | Status: AC
  Administered 2014-06-08: 4 mg via INTRAVENOUS
  Filled 2014-06-08: qty 2

## 2014-06-08 MED ORDER — HYDROCODONE-ACETAMINOPHEN 5-325 MG PO TABS: 1.0000 | ORAL_TABLET | ORAL | Status: DC | PRN

## 2014-06-08 MED ORDER — KETOROLAC TROMETHAMINE 15 MG/ML IJ SOLN
15.0000 mg | Freq: Once | INTRAMUSCULAR | Status: AC
  Administered 2014-06-08: 15 mg via INTRAVENOUS
  Filled 2014-06-08: qty 1

## 2014-06-08 MED ORDER — ONDANSETRON HCL 4 MG PO TABS: 4.0000 mg | ORAL_TABLET | Freq: Four times a day (QID) | ORAL | Status: DC

## 2014-06-08 MED ORDER — HYDROMORPHONE HCL PF 1 MG/ML IJ SOLN
1.0000 mg | Freq: Once | INTRAMUSCULAR | Status: AC
  Administered 2014-06-08: 1 mg via INTRAVENOUS
  Filled 2014-06-08: qty 1

## 2014-06-08 NOTE — ED Notes (Signed)
Discharge instructions reviewed with pt. Pt verbalized understanding.   

## 2014-06-08 NOTE — ED Notes (Signed)
Patient returned from CT

## 2014-06-08 NOTE — ED Notes (Signed)
Dr. Kohut at bedside 

## 2014-06-08 NOTE — ED Notes (Addendum)
He c/o LLQ abd pain intermittent x 1 week. He states hes hardly eaten anything because it makes the pain so much worse. His doctor started him on oral abx Thursday for elevated wbc count on lab work in office but the pain has gotten much worse today.

## 2014-06-08 NOTE — ED Notes (Signed)
IV removed.

## 2014-06-08 NOTE — Discharge Instructions (Signed)

## 2014-06-08 NOTE — ED Provider Notes (Signed)
CSN: [id number]     Arrival date & time 06/08/14  1628 History   First MD Initiated Contact with Patient 06/08/14 1647     Chief Complaint  Patient presents with  . Abdominal Pain     (Consider location/radiation/quality/duration/timing/severity/associated sxs/prior Treatment) Patient is a 58 y.o. male presenting with abdominal pain.  Abdominal Pain Pain location:  LLQ Pain quality: stabbing   Pain severity:  Severe Onset quality:  Sudden Duration:  1 week Timing:  Intermittent Associated symptoms: nausea and vomiting   Associated symptoms: no chest pain, no cough, no dysuria, no hematuria and no shortness of breath     Past Medical History  Diagnosis Date  . Diverticulosis   . Asthma    History reviewed. No pertinent past surgical history. History reviewed. No pertinent family history. History  Substance Use Topics  . Smoking status: Never Smoker   . Smokeless tobacco: Not on file  . Alcohol Use: 1.2 oz/week    2 Glasses of wine per week    Review of Systems  Constitutional: Negative for activity change.  HENT: Negative for congestion.   Eyes: Negative for visual disturbance.  Respiratory: Negative for cough and shortness of breath.   Cardiovascular: Negative for chest pain and leg swelling.  Gastrointestinal: Positive for nausea, vomiting and abdominal pain. Negative for blood in stool.  Genitourinary: Negative for dysuria and hematuria.  Musculoskeletal: Negative for back pain.  Skin: Negative for color change.  Neurological: Negative for syncope and headaches.  Psychiatric/Behavioral: Negative for agitation.      Allergies  Aspirin  Home Medications   Prior to Admission medications   Medication Sig Start Date End Date Taking? Authorizing Provider  acetaminophen (TYLENOL) 325 MG tablet Take 650 mg by mouth every 6 (six) hours as needed for mild pain or moderate pain.   Yes Historical Provider, MD  amphetamine-dextroamphetamine (ADDERALL) 10 MG tablet  Take 20 mg by mouth 2 (two) times daily. 06/02/14  Yes Historical Provider, MD  ciprofloxacin (CIPRO) 500 MG tablet Take 500 mg by mouth 2 (two) times daily. 06/06/14 06/16/14 Yes Historical Provider, MD  fluticasone (FLONASE) 50 MCG/ACT nasal spray Place 2 sprays into both nostrils daily as needed. 06/02/14  Yes Historical Provider, MD  loratadine (CLARITIN) 10 MG tablet Take 10 mg by mouth daily.   Yes Historical Provider, MD  metroNIDAZOLE (FLAGYL) 500 MG tablet Take 500 mg by mouth every 8 (eight) hours. For 10 days 06/06/14 06/16/14 Yes Historical Provider, MD  quinapril-hydrochlorothiazide (ACCURETIC) 20-25 MG per tablet Take 1 tablet by mouth daily. 05/30/14  Yes Historical Provider, MD  CIALIS 20 MG tablet Take 20 mg by mouth daily as needed. Prior to sexual activity 03/11/14   Historical Provider, MD  VENTOLIN HFA 108 (90 BASE) MCG/ACT inhaler Inhale 2 puffs into the lungs every 4 (four) hours as needed. 03/11/14   Historical Provider, MD   BP 118/68  Pulse 55  Temp(Src) 98.5 F (36.9 C) (Oral)  Resp 20  Ht 5\' 11"  (1.803 m)  Wt 195 lb (88.451 kg)  BMI 27.21 kg/m2  SpO2 92% Physical Exam  Nursing note and vitals reviewed. Constitutional: He is oriented to person, place, and time. He appears well-developed and well-nourished.  HENT:  Head: Normocephalic.  Eyes: Pupils are equal, round, and reactive to light.  Neck: Neck supple.  Cardiovascular: Normal rate and regular rhythm.  Exam reveals no gallop and no friction rub.   No murmur heard. Pulmonary/Chest: Effort normal. No respiratory distress.  Abdominal: Soft.  He exhibits no distension. There is tenderness in the left lower quadrant. There is no rigidity, no rebound, no guarding, no tenderness at McBurney's point and negative Murphy's sign.  Musculoskeletal: He exhibits no edema.  Neurological: He is alert and oriented to person, place, and time.  Skin: Skin is warm.  Psychiatric: He has a normal mood and affect.    ED Course   Procedures (including critical care time) Labs Review Labs Reviewed  CBC WITH DIFFERENTIAL - Abnormal; Notable for the following:    Neutrophils Relative % 78 (*)    Lymphocytes Relative 7 (*)    Lymphs Abs 0.6 (*)    Monocytes Relative 13 (*)    Monocytes Absolute 1.2 (*)    All other components within normal limits  COMPREHENSIVE METABOLIC PANEL - Abnormal; Notable for the following:    Potassium 3.5 (*)    Chloride 95 (*)    Glucose, Bld 108 (*)    Creatinine, Ser 1.59 (*)    GFR calc non Af Amer 47 (*)    GFR calc Af Amer 54 (*)    Anion gap 17 (*)    All other components within normal limits  URINALYSIS, ROUTINE W REFLEX MICROSCOPIC - Abnormal; Notable for the following:    Color, Urine AMBER (*)    Ketones, ur 15 (*)    Leukocytes, UA SMALL (*)    All other components within normal limits  URINE MICROSCOPIC-ADD ON    Imaging Review Ct Angio Abd/pel W/ And/or W/o  06/08/2014   CLINICAL DATA:  Left lower quadrant pain. Mesenteric ischemia versus diverticulitis. Nausea and decreased appetite.  EXAM: CTA ABDOMEN AND PELVIS wITHOUT AND WITH CONTRAST  TECHNIQUE: Multidetector CT imaging of the abdomen and pelvis was performed using the standard protocol during bolus administration of intravenous contrast. Multiplanar reconstructed images and MIPs were obtained and reviewed to evaluate the vascular anatomy.  CONTRAST:  OMNIPAQUE IOHEXOL 350 MG/ML SOLN  COMPARISON:  None.  FINDINGS: Lower chest: Clear lung bases. Borderline cardiomegaly, without pericardial or pleural effusion.  Liver: Too small to characterize right liver lobe lesion. Likely a tiny cyst.  Spleen: Splenules  Stomach: Normal, without wall thickening.  Pancreas: Normal, without mass or pancreatic ductal dilatation.  Gallbladder/Biliary Tree: Normal gallbladder, without intra or extrahepatic biliary ductal dilatation.  Kidneys/Adrenals: Normal adrenal glands. Normal right kidney. Punctate upper pole left renal  collecting system calculus. Mild to moderate left-sided urinary tract obstruction secondary to a proximal left ureteric stone which measures 7 x 11 mm on transverse image 48/series 10 and coronal image 73/series 9. Mildly delayed left renal function with persistent corticomedullary differentiation on early delayed images.  Bowel loops: Scattered colonic diverticula. Normal terminal ileum and appendix. Normal small bowel without abdominal ascites.  Vascular: Celiac axis and superior mesenteric artery both widely patent. Single renal arteries bilaterally, without significant stenosis. There is atherosclerosis in the infrarenal aorta, but no dissection. Inferior mesenteric artery widely patent. Both common iliac arteries are normal in caliber, without significant stenosis. The internal iliacs are both patent.  Nodes: No retroperitoneal or retrocrural adenopathy. No pelvic adenopathy.  Pelvic Genitourinary: Normal urinary bladder and prostate.  Other: No significant free fluid.  Bones/Musculoskeletal: Congenitally short lumbar pedicles. L1-2 and L3-4 disc bulges.  Review of the MIP images confirms the above findings.  IMPRESSION: 1. Obstructive proximal left ureteric calculus. 2. Minimal aortic atherosclerosis, without mesenteric ischemia. 3. Left nephrolithiasis.   Electronically Signed   By: Jeronimo Greaves M.D.   On:  06/08/2014 21:46     EKG Interpretation None      MDM   Final diagnoses:  Kidney stone   58 year old male with past medical history of diverticulosis the presents with intermittent severe abdominal pain this been occurring over the last week. Patient states that when he does not eat he has a 3/10 pain although that is 9. However when the patient eats he has an 8/10 pain with associated nausea no vomiting. The pain is localized to the left lower quadrant. Patient denies any hematuria or dysuria. As a result patient has not been eating. Currently the patient is not in significant pain however  states that he has not eaten all day.  Patient evaluated with CT angiography of the abdomen for concern of mesenteric ischemia. Patient also evaluated with screening labs. CT scan demonstrates that the patient has an obstructing kidney stone. No evidence of mesenteric ischemia. Screening labs were performed as noted that the patient has an increased creatinine from his baseline. Patient also evaluated with UA which only demonstrated small loops of likely a true infection. Patient denies fever and chills.  Results and findings were discussed with the patient and he was instructed to follow up with urology. Patient was given return precautions for fevers chills dysuria hematuria and pain that is not controlled with prescribed Norco. Patient understands his discharge instructions and states that he will call urology on Monday morning for followup. At this time the patient was discharged home.     Clement Sayres, MD 06/10/14 1610  Clement Sayres, MD 06/10/14 [phone]

## 2014-06-10 ENCOUNTER — Encounter (HOSPITAL_COMMUNITY): Payer: Self-pay | Admitting: *Deleted

## 2014-06-10 ENCOUNTER — Other Ambulatory Visit: Payer: Self-pay | Admitting: Urology

## 2014-06-10 ENCOUNTER — Ambulatory Visit (HOSPITAL_COMMUNITY)
Admission: RE | Admit: 2014-06-10 | Discharge: 2014-06-10 | Disposition: A | Discharge status: Home / Self Care | Payer: 59 | Source: Ambulatory Visit | Attending: Urology | Admitting: Urology

## 2014-06-10 ENCOUNTER — Encounter (HOSPITAL_COMMUNITY)
Admission: RE | Disposition: A | Discharge status: Home / Self Care | Payer: Self-pay | Source: Ambulatory Visit | Attending: Urology

## 2014-06-10 ENCOUNTER — Ambulatory Visit (HOSPITAL_COMMUNITY): Payer: 59

## 2014-06-10 DIAGNOSIS — I10 Essential (primary) hypertension: Secondary | ICD-10-CM | POA: Insufficient documentation

## 2014-06-10 DIAGNOSIS — Z79899 Other long term (current) drug therapy: Secondary | ICD-10-CM | POA: Diagnosis not present

## 2014-06-10 DIAGNOSIS — Z888 Allergy status to other drugs, medicaments and biological substances status: Secondary | ICD-10-CM | POA: Diagnosis not present

## 2014-06-10 DIAGNOSIS — J45909 Unspecified asthma, uncomplicated: Secondary | ICD-10-CM | POA: Diagnosis not present

## 2014-06-10 DIAGNOSIS — N201 Calculus of ureter: Secondary | ICD-10-CM

## 2014-06-10 SURGERY — LITHOTRIPSY, ESWL
Anesthesia: LOCAL | Laterality: Left

## 2014-06-10 MED ORDER — LEVOFLOXACIN 500 MG PO TABS
500.0000 mg | ORAL_TABLET | ORAL | Status: AC
  Administered 2014-06-10: 500 mg via ORAL
  Filled 2014-06-10: qty 1

## 2014-06-10 MED ORDER — DIPHENHYDRAMINE HCL 25 MG PO CAPS
25.0000 mg | ORAL_CAPSULE | ORAL | Status: AC
  Administered 2014-06-10: 25 mg via ORAL
  Filled 2014-06-10: qty 1

## 2014-06-10 MED ORDER — DIAZEPAM 5 MG PO TABS
10.0000 mg | ORAL_TABLET | ORAL | Status: AC
  Administered 2014-06-10: 10 mg via ORAL
  Filled 2014-06-10: qty 2

## 2014-06-10 MED ORDER — DEXTROSE-NACL 5-0.45 % IV SOLN
INTRAVENOUS | Status: DC
  Administered 2014-06-10: 17:00:00 via INTRAVENOUS

## 2014-06-10 NOTE — Discharge Instructions (Signed)
See [facility] discharge instructions in chart.  

## 2014-06-10 NOTE — H&P (Signed)
Urology History and Physical Exam  CC: kidney stone  HPI: 58 year old male presents at this time for management of a 7 x 11 mm left proximal ureteral stone.  He has been symptomatic for about 8 days.  He presented to my office this morning following recent emergency room visit where this was diagnosed.  He comes to the patient's pain, the location of the stone, and his desire to have management, we our proceeding with lithotripsy.  He has been counseled regarding risks complications and alternatives to this treatment.  PMH: Past Medical History  Diagnosis Date  . Diverticulosis   . Asthma   . Hypertension     PSH: Past Surgical History  Procedure Laterality Date  . Eye surgery      LASIK   . Retinal detachment surgery Right 2015  . Colonoscopy  2014    Allergies: Allergies  Allergen Reactions  . Aspirin Rash    Medications: Prescriptions prior to admission  Medication Sig Dispense Refill  . acetaminophen (TYLENOL) 325 MG tablet Take 650 mg by mouth every 6 (six) hours as needed for mild pain or moderate pain.      Marland Kitchen amphetamine-dextroamphetamine (ADDERALL) 10 MG tablet Take 20 mg by mouth 2 (two) times daily.      Marland Kitchen atorvastatin (LIPITOR) 10 MG tablet Take 10 mg by mouth daily.      Marland Kitchen CIALIS 20 MG tablet Take 20 mg by mouth daily as needed. Prior to sexual activity      . fluticasone (FLONASE) 50 MCG/ACT nasal spray Place 2 sprays into both nostrils daily as needed.      Marland Kitchen HYDROcodone-acetaminophen (NORCO/VICODIN) 5-325 MG per tablet Take 1-2 tablets by mouth every 4 (four) hours as needed for moderate pain or severe pain.  30 tablet  0  . loratadine (CLARITIN) 10 MG tablet Take 10 mg by mouth daily.      . ondansetron (ZOFRAN) 4 MG tablet Take 1 tablet (4 mg total) by mouth every 6 (six) hours.  20 tablet  0  . quinapril-hydrochlorothiazide (ACCURETIC) 20-25 MG per tablet Take 1 tablet by mouth daily.      . VENTOLIN HFA 108 (90 BASE) MCG/ACT inhaler Inhale 2 puffs into  the lungs every 4 (four) hours as needed.         Social History: History   Social History  . Marital Status: Married    Spouse Name: N/A    Number of Children: N/A  . Years of Education: N/A   Occupational History  . Not on file.   Social History Main Topics  . Smoking status: Never Smoker   . Smokeless tobacco: Never Used  . Alcohol Use: 1.2 oz/week    2 Glasses of wine per week  . Drug Use: No  . Sexual Activity: Not on file   Other Topics Concern  . Not on file   Social History Narrative  . No narrative on file    Family History: History reviewed. No pertinent family history.  Review of Systems: Positive: flank pain, nausea Negative: .  A further 10 point review of systems was negative except what is listed in the HPI.                  Physical Exam: @VITALS2 @ General: No acute distress.  Awake. Head:  Normocephalic.  Atraumatic. ENT:  EOMI.  Mucous membranes moist Neck:  Supple.  No lymphadenopathy. CV:  S1 present. S2 present. Regular rate. Pulmonary: Equal effort bilaterally.  Clear to auscultation bilaterally. Abdomen: Soft.  Mild left lower quadrant tenderness.  No mass Skin:  Normal turgor.  No visible rash. Extremity: No gross deformity of bilateral upper extremities.  No gross deformity of                             lower extremities. Neurologic: Alert. Appropriate mood.    Studies:  Recent Labs     06/08/14  1642  HGB  14.3  WBC  9.2  PLT  155    Recent Labs     06/08/14  1642  NA  139  K  3.5*  CL  95*  CO2  27  BUN  18  CREATININE  1.59*  CALCIUM  9.8  GFRNONAA  47*  GFRAA  54*     No results found for this basename: PT, INR, APTT,  in the last 72 hours   No components found with this basename: ABG,     Assessment:  7 x 11 mm left proximal ureteral stone.  Hounsfield units approximately 800, skin to stone distance 13 cm  Plan: ESL of left proximal ureteral stone

## 2014-06-13 NOTE — ED Provider Notes (Signed)
I saw and evaluated the patient, reviewed the resident's note and I agree with the findings and plan.   EKG Interpretation None      58 rolled male with left lower quadrant pain. Workup significant for obstructing left ureteral stone. Interestingly, his symptoms seem to be worse postprandial. Given the duration of the symptoms, the proximal location and size of the stone, I suspect that he may ultimately need intervention. There is some renal impairment. Baseline unclear. It may potentially be some element of prerenal with decreased by mouth intake secondary to postprandial symptoms. Symptoms have been controlled with medications in the emergency room.. I feel he is safe for discharge at this time. Urologic followup information provided.  Raeford RazorStephen Mykal Kirchman, MD 06/13/14 [phone]

## 2014-06-24 ENCOUNTER — Ambulatory Visit (INDEPENDENT_AMBULATORY_CARE_PROVIDER_SITE_OTHER): Payer: 59 | Admitting: Ophthalmology

## 2017-11-27 ENCOUNTER — Encounter (HOSPITAL_COMMUNITY)
Admission: EM | Disposition: A | Discharge status: Home / Self Care | Payer: Self-pay | Source: Home / Self Care | Attending: Emergency Medicine

## 2017-11-27 ENCOUNTER — Emergency Department (HOSPITAL_COMMUNITY): Payer: BLUE CROSS/BLUE SHIELD

## 2017-11-27 ENCOUNTER — Other Ambulatory Visit: Payer: Self-pay

## 2017-11-27 ENCOUNTER — Emergency Department (HOSPITAL_COMMUNITY): Payer: BLUE CROSS/BLUE SHIELD | Admitting: Anesthesiology

## 2017-11-27 ENCOUNTER — Encounter (HOSPITAL_COMMUNITY): Payer: Self-pay | Admitting: *Deleted

## 2017-11-27 ENCOUNTER — Observation Stay (HOSPITAL_COMMUNITY)
Admission: EM | Admit: 2017-11-27 | Discharge: 2017-11-28 | Disposition: A | Discharge status: Home / Self Care | Payer: BLUE CROSS/BLUE SHIELD | Attending: Surgery | Admitting: Surgery

## 2017-11-27 DIAGNOSIS — I10 Essential (primary) hypertension: Secondary | ICD-10-CM | POA: Insufficient documentation

## 2017-11-27 DIAGNOSIS — F909 Attention-deficit hyperactivity disorder, unspecified type: Secondary | ICD-10-CM | POA: Diagnosis not present

## 2017-11-27 DIAGNOSIS — Z79899 Other long term (current) drug therapy: Secondary | ICD-10-CM | POA: Diagnosis not present

## 2017-11-27 DIAGNOSIS — K3533 Acute appendicitis with perforation and localized peritonitis, with abscess: Secondary | ICD-10-CM

## 2017-11-27 DIAGNOSIS — K353 Acute appendicitis with localized peritonitis, without perforation or gangrene: Principal | ICD-10-CM | POA: Insufficient documentation

## 2017-11-27 DIAGNOSIS — K3580 Unspecified acute appendicitis: Secondary | ICD-10-CM | POA: Diagnosis present

## 2017-11-27 DIAGNOSIS — E7800 Pure hypercholesterolemia, unspecified: Secondary | ICD-10-CM | POA: Diagnosis not present

## 2017-11-27 HISTORY — PX: LAPAROSCOPIC APPENDECTOMY: SHX408

## 2017-11-27 HISTORY — DX: Other specified behavioral and emotional disorders with onset usually occurring in childhood and adolescence: F98.8

## 2017-11-27 HISTORY — DX: Pure hypercholesterolemia, unspecified: E78.00

## 2017-11-27 LAB — CBC
HEMATOCRIT: 48.1 % (ref 39.0–52.0)
Hemoglobin: 16.3 g/dL (ref 13.0–17.0)
MCH: 29.9 pg (ref 26.0–34.0)
MCHC: 33.9 g/dL (ref 30.0–36.0)
MCV: 88.1 fL (ref 78.0–100.0)
Platelets: 149 10*3/uL — ABNORMAL LOW (ref 150–400)
RBC: 5.46 MIL/uL (ref 4.22–5.81)
RDW: 12.9 % (ref 11.5–15.5)
WBC: 13.5 10*3/uL — ABNORMAL HIGH (ref 4.0–10.5)

## 2017-11-27 LAB — URINALYSIS, ROUTINE W REFLEX MICROSCOPIC
Bacteria, UA: NONE SEEN
Bilirubin Urine: NEGATIVE
Glucose, UA: NEGATIVE mg/dL
Hgb urine dipstick: NEGATIVE
KETONES UR: 80 mg/dL — AB
Leukocytes, UA: NEGATIVE
Nitrite: NEGATIVE
Protein, ur: 100 mg/dL — AB
Specific Gravity, Urine: 1.028 (ref 1.005–1.030)
Squamous Epithelial / LPF: NONE SEEN
pH: 5 (ref 5.0–8.0)

## 2017-11-27 LAB — LIPASE, BLOOD: Lipase: 31 U/L (ref 11–51)

## 2017-11-27 LAB — COMPREHENSIVE METABOLIC PANEL
ALBUMIN: 4.7 g/dL (ref 3.5–5.0)
ALK PHOS: 61 U/L (ref 38–126)
ALT: 70 U/L — ABNORMAL HIGH (ref 17–63)
AST: 57 U/L — AB (ref 15–41)
Anion gap: 13 (ref 5–15)
BUN: 22 mg/dL — AB (ref 6–20)
CALCIUM: 9.6 mg/dL (ref 8.9–10.3)
CO2: 23 mmol/L (ref 22–32)
Chloride: 98 mmol/L — ABNORMAL LOW (ref 101–111)
Creatinine, Ser: 1.18 mg/dL (ref 0.61–1.24)
GFR calc Af Amer: >60 mL/min (ref >60)
GFR calc non Af Amer: >60 mL/min (ref >60)
Glucose, Bld: 99 mg/dL (ref 65–99)
POTASSIUM: 4.1 mmol/L (ref 3.5–5.1)
SODIUM: 134 mmol/L — AB (ref 135–145)
Total Bilirubin: 1.6 mg/dL — ABNORMAL HIGH (ref 0.3–1.2)
Total Protein: 7.7 g/dL (ref 6.5–8.1)

## 2017-11-27 LAB — GLUCOSE, CAPILLARY: GLUCOSE-CAPILLARY: 110 mg/dL — AB (ref 65–99)

## 2017-11-27 SURGERY — APPENDECTOMY, LAPAROSCOPIC
Anesthesia: General | Site: Abdomen

## 2017-11-27 MED ORDER — ONDANSETRON 4 MG PO TBDP
4.0000 mg | ORAL_TABLET | Freq: Once | ORAL | Status: AC
  Administered 2017-11-27: 4 mg via ORAL
  Filled 2017-11-27: qty 1

## 2017-11-27 MED ORDER — DIPHENHYDRAMINE HCL 50 MG/ML IJ SOLN: 25.0000 mg | Freq: Four times a day (QID) | INTRAMUSCULAR | Status: DC | PRN

## 2017-11-27 MED ORDER — LIDOCAINE 2% (20 MG/ML) 5 ML SYRINGE
INTRAMUSCULAR | Status: AC
  Filled 2017-11-27: qty 5

## 2017-11-27 MED ORDER — ENOXAPARIN SODIUM 40 MG/0.4ML ~~LOC~~ SOLN: 40.0000 mg | SUBCUTANEOUS | Status: DC

## 2017-11-27 MED ORDER — SUFENTANIL CITRATE 50 MCG/ML IV SOLN
INTRAVENOUS | Status: AC
  Filled 2017-11-27: qty 1

## 2017-11-27 MED ORDER — SUCCINYLCHOLINE CHLORIDE 200 MG/10ML IV SOSY
PREFILLED_SYRINGE | INTRAVENOUS | Status: AC
  Filled 2017-11-27: qty 10

## 2017-11-27 MED ORDER — ONDANSETRON HCL 4 MG/2ML IJ SOLN
4.0000 mg | Freq: Once | INTRAMUSCULAR | Status: AC
  Administered 2017-11-27: 4 mg via INTRAVENOUS
  Filled 2017-11-27: qty 2

## 2017-11-27 MED ORDER — ONDANSETRON HCL 4 MG/2ML IJ SOLN: 4.0000 mg | Freq: Four times a day (QID) | INTRAMUSCULAR | Status: DC | PRN

## 2017-11-27 MED ORDER — OXYCODONE-ACETAMINOPHEN 5-325 MG PO TABS
1.0000 | ORAL_TABLET | ORAL | Status: DC | PRN
  Administered 2017-11-27: 1 via ORAL
  Filled 2017-11-27: qty 1

## 2017-11-27 MED ORDER — SUGAMMADEX SODIUM 200 MG/2ML IV SOLN
INTRAVENOUS | Status: DC | PRN
  Administered 2017-11-27: 200 mg via INTRAVENOUS

## 2017-11-27 MED ORDER — LISINOPRIL 20 MG PO TABS
20.0000 mg | ORAL_TABLET | Freq: Every day | ORAL | Status: DC
  Administered 2017-11-28: 20 mg via ORAL
  Filled 2017-11-27: qty 1

## 2017-11-27 MED ORDER — EPHEDRINE 5 MG/ML INJ
INTRAVENOUS | Status: AC
  Filled 2017-11-27: qty 10

## 2017-11-27 MED ORDER — LACTATED RINGERS IV SOLN
INTRAVENOUS | Status: DC | PRN
  Administered 2017-11-27 (×2): via INTRAVENOUS

## 2017-11-27 MED ORDER — BUPIVACAINE-EPINEPHRINE (PF) 0.5% -1:200000 IJ SOLN
INTRAMUSCULAR | Status: AC
  Filled 2017-11-27: qty 30

## 2017-11-27 MED ORDER — SODIUM CHLORIDE 0.9 % IR SOLN
Status: DC | PRN
Start: 2017-11-27 — End: 2017-11-27
  Administered 2017-11-27: 1000 mL

## 2017-11-27 MED ORDER — HYDROCHLOROTHIAZIDE 25 MG PO TABS
25.0000 mg | ORAL_TABLET | Freq: Every day | ORAL | Status: DC
  Administered 2017-11-28: 25 mg via ORAL
  Filled 2017-11-27: qty 1

## 2017-11-27 MED ORDER — PROMETHAZINE HCL 25 MG/ML IJ SOLN: 6.2500 mg | INTRAMUSCULAR | Status: DC | PRN

## 2017-11-27 MED ORDER — BUPIVACAINE-EPINEPHRINE 0.5% -1:200000 IJ SOLN
INTRAMUSCULAR | Status: DC | PRN
  Administered 2017-11-27: 20 mL

## 2017-11-27 MED ORDER — TRAMADOL HCL 50 MG PO TABS: 50.0000 mg | ORAL_TABLET | Freq: Four times a day (QID) | ORAL | Status: DC | PRN

## 2017-11-27 MED ORDER — MIDAZOLAM HCL 5 MG/5ML IJ SOLN
INTRAMUSCULAR | Status: DC | PRN
  Administered 2017-11-27: 2 mg via INTRAVENOUS

## 2017-11-27 MED ORDER — HYDROMORPHONE HCL 1 MG/ML IJ SOLN: 0.2500 mg | INTRAMUSCULAR | Status: DC | PRN

## 2017-11-27 MED ORDER — SUFENTANIL CITRATE 50 MCG/ML IV SOLN
INTRAVENOUS | Status: DC | PRN
  Administered 2017-11-27: 15 ug via INTRAVENOUS
  Administered 2017-11-27: 10 ug via INTRAVENOUS

## 2017-11-27 MED ORDER — QUINAPRIL-HYDROCHLOROTHIAZIDE 20-25 MG PO TABS: 1.0000 | ORAL_TABLET | Freq: Every day | ORAL | Status: DC

## 2017-11-27 MED ORDER — SODIUM CHLORIDE 0.9 % IJ SOLN
INTRAMUSCULAR | Status: AC
  Filled 2017-11-27: qty 10

## 2017-11-27 MED ORDER — ROCURONIUM BROMIDE 100 MG/10ML IV SOLN
INTRAVENOUS | Status: DC | PRN
  Administered 2017-11-27: 30 mg via INTRAVENOUS

## 2017-11-27 MED ORDER — DEXAMETHASONE SODIUM PHOSPHATE 10 MG/ML IJ SOLN
INTRAMUSCULAR | Status: AC
  Filled 2017-11-27: qty 1

## 2017-11-27 MED ORDER — METHOCARBAMOL 500 MG PO TABS
500.0000 mg | ORAL_TABLET | Freq: Four times a day (QID) | ORAL | Status: DC | PRN
  Administered 2017-11-27: 500 mg via ORAL
  Filled 2017-11-27 (×2): qty 1

## 2017-11-27 MED ORDER — PIPERACILLIN-TAZOBACTAM 3.375 G IVPB 30 MIN
3.3750 g | Freq: Once | INTRAVENOUS | Status: AC
Start: 2017-11-27 — End: 2017-11-27
  Administered 2017-11-27: 3.375 g via INTRAVENOUS
  Filled 2017-11-27: qty 50

## 2017-11-27 MED ORDER — IOPAMIDOL (ISOVUE-300) INJECTION 61%
INTRAVENOUS | Status: AC
  Administered 2017-11-27: 100 mL
  Filled 2017-11-27: qty 100

## 2017-11-27 MED ORDER — HYDROMORPHONE HCL 1 MG/ML IJ SOLN
1.0000 mg | Freq: Once | INTRAMUSCULAR | Status: AC
  Administered 2017-11-27: 1 mg via INTRAVENOUS
  Filled 2017-11-27: qty 1

## 2017-11-27 MED ORDER — 0.9 % SODIUM CHLORIDE (POUR BTL) OPTIME
TOPICAL | Status: DC | PRN
Start: 2017-11-27 — End: 2017-11-27
  Administered 2017-11-27: 1000 mL

## 2017-11-27 MED ORDER — OXYCODONE HCL 5 MG PO TABS
5.0000 mg | ORAL_TABLET | ORAL | Status: DC | PRN
  Administered 2017-11-27: 10 mg via ORAL
  Filled 2017-11-27 (×2): qty 2

## 2017-11-27 MED ORDER — PIPERACILLIN-TAZOBACTAM 3.375 G IVPB
3.3750 g | Freq: Three times a day (TID) | INTRAVENOUS | Status: DC
  Filled 2017-11-27: qty 50

## 2017-11-27 MED ORDER — DIPHENHYDRAMINE HCL 25 MG PO CAPS: 25.0000 mg | ORAL_CAPSULE | Freq: Four times a day (QID) | ORAL | Status: DC | PRN

## 2017-11-27 MED ORDER — EPHEDRINE SULFATE 50 MG/ML IJ SOLN
INTRAMUSCULAR | Status: DC | PRN
  Administered 2017-11-27: 10 mg via INTRAVENOUS

## 2017-11-27 MED ORDER — MORPHINE SULFATE (PF) 4 MG/ML IV SOLN: 1.0000 mg | INTRAVENOUS | Status: DC | PRN

## 2017-11-27 MED ORDER — ONDANSETRON HCL 4 MG/2ML IJ SOLN
INTRAMUSCULAR | Status: AC
  Filled 2017-11-27: qty 2

## 2017-11-27 MED ORDER — SODIUM CHLORIDE 0.9 % IV BOLUS (SEPSIS)
1000.0000 mL | Freq: Once | INTRAVENOUS | Status: AC
  Administered 2017-11-27: 1000 mL via INTRAVENOUS

## 2017-11-27 MED ORDER — PROPOFOL 10 MG/ML IV BOLUS
INTRAVENOUS | Status: DC | PRN
  Administered 2017-11-27: 200 mg via INTRAVENOUS

## 2017-11-27 MED ORDER — PROPOFOL 10 MG/ML IV BOLUS
INTRAVENOUS | Status: AC
  Filled 2017-11-27: qty 20

## 2017-11-27 MED ORDER — MIDAZOLAM HCL 2 MG/2ML IJ SOLN
INTRAMUSCULAR | Status: AC
  Filled 2017-11-27: qty 2

## 2017-11-27 MED ORDER — POTASSIUM CHLORIDE IN NACL 20-0.9 MEQ/L-% IV SOLN
INTRAVENOUS | Status: DC
  Administered 2017-11-27: 23:00:00 via INTRAVENOUS
  Filled 2017-11-27: qty 1000

## 2017-11-27 MED ORDER — ONDANSETRON HCL 4 MG/2ML IJ SOLN
INTRAMUSCULAR | Status: DC | PRN
  Administered 2017-11-27: 4 mg via INTRAVENOUS

## 2017-11-27 MED ORDER — SUCCINYLCHOLINE CHLORIDE 20 MG/ML IJ SOLN
INTRAMUSCULAR | Status: DC | PRN
  Administered 2017-11-27: 140 mg via INTRAVENOUS

## 2017-11-27 MED ORDER — SUGAMMADEX SODIUM 200 MG/2ML IV SOLN
INTRAVENOUS | Status: AC
  Filled 2017-11-27: qty 2

## 2017-11-27 MED ORDER — DEXAMETHASONE SODIUM PHOSPHATE 10 MG/ML IJ SOLN
INTRAMUSCULAR | Status: DC | PRN
  Administered 2017-11-27: 10 mg via INTRAVENOUS

## 2017-11-27 MED ORDER — LIDOCAINE HCL (CARDIAC) 20 MG/ML IV SOLN
INTRAVENOUS | Status: DC | PRN
  Administered 2017-11-27: 100 mg via INTRATRACHEAL

## 2017-11-27 MED ORDER — ONDANSETRON 4 MG PO TBDP: 4.0000 mg | ORAL_TABLET | Freq: Four times a day (QID) | ORAL | Status: DC | PRN

## 2017-11-27 MED ORDER — ALBUTEROL SULFATE (2.5 MG/3ML) 0.083% IN NEBU: 2.5000 mg | INHALATION_SOLUTION | RESPIRATORY_TRACT | Status: DC | PRN

## 2017-11-27 SURGICAL SUPPLY — 40 items
APPLIER CLIP 5 13 M/L LIGAMAX5 (MISCELLANEOUS)
APPLIER CLIP ROT 10 11.4 M/L (STAPLE)
CANISTER SUCT 3000ML PPV (MISCELLANEOUS) ×2 IMPLANT
CHLORAPREP W/TINT 26ML (MISCELLANEOUS) ×2 IMPLANT
CLIP APPLIE 5 13 M/L LIGAMAX5 (MISCELLANEOUS) IMPLANT
CLIP APPLIE ROT 10 11.4 M/L (STAPLE) IMPLANT
CONT SPEC 4OZ CLIKSEAL STRL BL (MISCELLANEOUS) ×2 IMPLANT
COVER SURGICAL LIGHT HANDLE (MISCELLANEOUS) ×2 IMPLANT
CUTTER FLEX LINEAR 45M (STAPLE) ×2 IMPLANT
DERMABOND ADVANCED (GAUZE/BANDAGES/DRESSINGS) ×1
DERMABOND ADVANCED .7 DNX12 (GAUZE/BANDAGES/DRESSINGS) ×1 IMPLANT
ELECT REM PT RETURN 9FT ADLT (ELECTROSURGICAL) ×2
ELECTRODE REM PT RTRN 9FT ADLT (ELECTROSURGICAL) ×1 IMPLANT
GLOVE BIOGEL PI IND STRL 7.0 (GLOVE) ×1 IMPLANT
GLOVE BIOGEL PI INDICATOR 7.0 (GLOVE) ×1
GLOVE SURG SIGNA 7.5 PF LTX (GLOVE) ×2 IMPLANT
GLOVE SURG SS PI 7.0 STRL IVOR (GLOVE) ×4 IMPLANT
GOWN STRL REUS W/ TWL LRG LVL3 (GOWN DISPOSABLE) ×1 IMPLANT
GOWN STRL REUS W/ TWL XL LVL3 (GOWN DISPOSABLE) ×1 IMPLANT
GOWN STRL REUS W/TWL LRG LVL3 (GOWN DISPOSABLE) ×1
GOWN STRL REUS W/TWL XL LVL3 (GOWN DISPOSABLE) ×1
KIT BASIN OR (CUSTOM PROCEDURE TRAY) ×2 IMPLANT
KIT ROOM TURNOVER OR (KITS) ×2 IMPLANT
NS IRRIG 1000ML POUR BTL (IV SOLUTION) ×2 IMPLANT
PAD ARMBOARD 7.5X6 YLW CONV (MISCELLANEOUS) ×4 IMPLANT
POUCH SPECIMEN RETRIEVAL 10MM (ENDOMECHANICALS) ×2 IMPLANT
RELOAD 45 VASCULAR/THIN (ENDOMECHANICALS) IMPLANT
RELOAD STAPLE TA45 3.5 REG BLU (ENDOMECHANICALS) ×2 IMPLANT
SET IRRIG TUBING LAPAROSCOPIC (IRRIGATION / IRRIGATOR) ×2 IMPLANT
SHEARS HARMONIC ACE PLUS 36CM (ENDOMECHANICALS) ×2 IMPLANT
SLEEVE ENDOPATH XCEL 5M (ENDOMECHANICALS) ×2 IMPLANT
SPECIMEN JAR SMALL (MISCELLANEOUS) ×2 IMPLANT
SUT MON AB 4-0 PC3 18 (SUTURE) ×2 IMPLANT
TOWEL OR 17X24 6PK STRL BLUE (TOWEL DISPOSABLE) IMPLANT
TOWEL OR 17X26 10 PK STRL BLUE (TOWEL DISPOSABLE) ×2 IMPLANT
TRAY LAPAROSCOPIC MC (CUSTOM PROCEDURE TRAY) ×2 IMPLANT
TROCAR XCEL BLUNT TIP 100MML (ENDOMECHANICALS) ×2 IMPLANT
TROCAR XCEL NON-BLD 5MMX100MML (ENDOMECHANICALS) ×2 IMPLANT
TUBING INSUFFLATION (TUBING) ×2 IMPLANT
WATER STERILE IRR 1000ML POUR (IV SOLUTION) ×2 IMPLANT

## 2017-11-27 NOTE — Anesthesia Postprocedure Evaluation (Signed)
Anesthesia Post Note  Patient: Jimmy Santos  Procedure(s) Performed: APPENDECTOMY LAPAROSCOPIC (N/A Abdomen)     Patient location during evaluation: PACU Anesthesia Type: General Level of consciousness: awake and alert Pain management: pain level controlled Vital Signs Assessment: post-procedure vital signs reviewed and stable Respiratory status: spontaneous breathing, nonlabored ventilation, respiratory function stable and patient connected to nasal cannula oxygen Cardiovascular status: blood pressure returned to baseline and stable Postop Assessment: no apparent nausea or vomiting Anesthetic complications: no    Last Vitals:  Vitals:   11/27/17 2000 11/27/17 2015  BP: (!) 149/87 133/85  Pulse: (!) 59 (!) 57  Resp: 18 16  Temp:  36.7 C  SpO2: 100% 100%    Last Pain:  Vitals:   11/27/17 1810  TempSrc:   PainSc: 3                  Kennieth RadFitzgerald, Lenward Able E

## 2017-11-27 NOTE — Anesthesia Preprocedure Evaluation (Signed)
Anesthesia Evaluation  Patient identified by MRN, date of birth, ID band Patient awake    Reviewed: Allergy & Precautions, NPO status , Patient's Chart, lab work & pertinent test results  Airway Mallampati: II  TM Distance: >3 FB Neck ROM: Full    Dental  (+) Dental Advisory Given   Pulmonary asthma ,    breath sounds clear to auscultation       Cardiovascular hypertension, Pt. on medications  Rhythm:Regular Rate:Normal     Neuro/Psych negative neurological ROS     GI/Hepatic Neg liver ROS, Acute appendicitis   Endo/Other  negative endocrine ROS  Renal/GU negative Renal ROS     Musculoskeletal   Abdominal   Peds  Hematology negative hematology ROS (+)   Anesthesia Other Findings   Reproductive/Obstetrics                             Lab Results  Component Value Date   WBC 13.5 (H) 11/27/2017   HGB 16.3 11/27/2017   HCT 48.1 11/27/2017   MCV 88.1 11/27/2017   PLT 149 (L) 11/27/2017   Lab Results  Component Value Date   CREATININE 1.18 11/27/2017   BUN 22 (H) 11/27/2017   NA 134 (L) 11/27/2017   K 4.1 11/27/2017   CL 98 (L) 11/27/2017   CO2 23 11/27/2017    Anesthesia Physical Anesthesia Plan  ASA: II and emergent  Anesthesia Plan: General   Post-op Pain Management:    Induction: Intravenous, Rapid sequence and Cricoid pressure planned  PONV Risk Score and Plan: 2 and Dexamethasone, Ondansetron and Treatment may vary due to age or medical condition  Airway Management Planned: Oral ETT  Additional Equipment:   Intra-op Plan:   Post-operative Plan: Extubation in OR  Informed Consent: I have reviewed the patients History and Physical, chart, labs and discussed the procedure including the risks, benefits and alternatives for the proposed anesthesia with the patient or authorized representative who has indicated his/her understanding and acceptance.     Plan  Discussed with:   Anesthesia Plan Comments:         Anesthesia Quick Evaluation

## 2017-11-27 NOTE — ED Notes (Signed)
Pt transported to short stay bay 36. Consent signed by pt and witnessed by RN

## 2017-11-27 NOTE — ED Provider Notes (Signed)
[facility] EMERGENCY DEPARTMENT Provider Note  CSN: [id number] Arrival date & time: 11/27/17 1215  Chief Complaint(s) Abdominal Pain  HPI Jimmy Santos is a 62 y.o. male   The history is provided by the patient.  Abdominal Pain   This is a new problem. The current episode started 12 to 24 hours ago. The problem occurs constantly. The problem has been gradually worsening. The pain is associated with an unknown factor. The pain is located in the suprapubic region and RLQ. The quality of the pain is cramping. The pain is moderate. Associated symptoms include diarrhea (loose stools) and nausea. Pertinent negatives include fever, vomiting, constipation and dysuria. The symptoms are aggravated by certain positions and palpation. Nothing relieves the symptoms. Past medical history comments: h/o diverticulosis.    Past Medical History Past Medical History:  Diagnosis Date  . ADD (attention deficit disorder)   . Asthma   . Diverticulosis   . Hypercholesteremia   . Hypertension    Patient Active Problem List   Diagnosis Date Noted  . HYPERLIPIDEMIA-MIXED 07/02/2010  . HYPERTENSION, BENIGN 07/02/2010  . SHORTNESS OF BREATH 07/02/2010   Home Medication(s) Prior to Admission medications   Medication Sig Start Date End Date Taking? Authorizing Provider  acetaminophen (TYLENOL) 325 MG tablet Take 650 mg by mouth every 6 (six) hours as needed for mild pain or moderate pain.   Yes [provider]  amphetamine-dextroamphetamine (ADDERALL) 10 MG tablet Take 20 mg by mouth daily.  06/02/14  Yes [provider]  atorvastatin (LIPITOR) 10 MG tablet Take 10 mg by mouth daily.   Yes [provider]  fluticasone (FLONASE) 50 MCG/ACT nasal spray Place 2 sprays into both nostrils daily as needed. 06/02/14  Yes [provider]  loratadine (CLARITIN) 10 MG tablet Take 10 mg by mouth daily.   Yes [provider]  quinapril-hydrochlorothiazide  (ACCURETIC) 20-25 MG per tablet Take 1 tablet by mouth daily. 05/30/14  Yes [provider]  VENTOLIN HFA 108 (90 BASE) MCG/ACT inhaler Inhale 2 puffs into the lungs every 4 (four) hours as needed. 03/11/14  Yes [provider]  CIALIS 20 MG tablet Take 20 mg by mouth daily as needed. Prior to sexual activity 03/11/14   [provider]  HYDROcodone-acetaminophen (NORCO/VICODIN) 5-325 MG per tablet Take 1-2 tablets by mouth every 4 (four) hours as needed for moderate pain or severe pain. Patient not taking: Reported on 11/27/2017 06/08/14   Clement SayresShepard, Staci, MD  ondansetron (ZOFRAN) 4 MG tablet Take 1 tablet (4 mg total) by mouth every 6 (six) hours. Patient not taking: Reported on 11/27/2017 06/08/14   Clement SayresShepard, Staci, MD                                                                                                                                    Past Surgical History Past Surgical History:  Procedure Laterality Date  . COLONOSCOPY  2014  .  EYE SURGERY     LASIK   . RETINAL DETACHMENT SURGERY Right 2015   Family History No family history on file.  Social History Social History   Tobacco Use  . Smoking status: Never Smoker  . Smokeless tobacco: Never Used  Substance Use Topics  . Alcohol use: Yes    Alcohol/week: 1.2 oz    Types: 2 Glasses of wine per week  . Drug use: No   Allergies Aspirin  Review of Systems Review of Systems  Constitutional: Negative for fever.  Gastrointestinal: Positive for abdominal pain, diarrhea (loose stools) and nausea. Negative for constipation and vomiting.  Genitourinary: Negative for dysuria.   All other systems are reviewed and are negative for acute change except as noted in the HPI  Physical Exam Vital Signs  I have reviewed the triage vital signs BP (!) 162/89 (BP Location: Right Arm)   Pulse (!) 56   Temp 98.1 F (36.7 C) (Oral)   Resp 16   Ht 5\' 11"  (1.803 m)   Wt 89.4 kg (197 lb)   SpO2 100%   BMI 27.48 kg/m     Physical Exam  Constitutional: He is oriented to person, place, and time. He appears well-developed and well-nourished. No distress.  HENT:  Head: Normocephalic and atraumatic.  Right Ear: External ear normal.  Left Ear: External ear normal.  Nose: Nose normal.  Mouth/Throat: Mucous membranes are normal. No trismus in the jaw.  Eyes: Conjunctivae and EOM are normal. No scleral icterus.  Neck: Normal range of motion and phonation normal.  Cardiovascular: Normal rate and regular rhythm.  Pulmonary/Chest: Effort normal. No stridor. No respiratory distress.  Abdominal: He exhibits no distension. There is tenderness in the right lower quadrant and suprapubic area. There is tenderness at McBurney's point. There is no rigidity, no rebound and no guarding. No hernia.  Genitourinary: Testes normal. Circumcised.  Musculoskeletal: Normal range of motion. He exhibits no edema.  Neurological: He is alert and oriented to person, place, and time.  Skin: He is not diaphoretic.  Psychiatric: He has a normal mood and affect. His behavior is normal.  Vitals reviewed.   ED Results and Treatments Labs (all labs ordered are listed, but only abnormal results are displayed) Labs Reviewed  COMPREHENSIVE METABOLIC PANEL - Abnormal; Notable for the following components:      Result Value   Sodium 134 (*)    Chloride 98 (*)    BUN 22 (*)    AST 57 (*)    ALT 70 (*)    Total Bilirubin 1.6 (*)    All other components within normal limits  CBC - Abnormal; Notable for the following components:   WBC 13.5 (*)    Platelets 149 (*)    All other components within normal limits  URINALYSIS, ROUTINE W REFLEX MICROSCOPIC - Abnormal; Notable for the following components:   Ketones, ur 80 (*)    Protein, ur 100 (*)    All other components within normal limits  LIPASE, BLOOD  EKG  EKG  Interpretation  Date/Time:    Ventricular Rate:    PR Interval:    QRS Duration:   QT Interval:    QTC Calculation:   R Axis:     Text Interpretation:        Radiology Ct Abdomen Pelvis W Contrast  Result Date: 11/27/2017 CLINICAL DATA:  Lower abdominal pain for 18 hours. EXAM: CT ABDOMEN AND PELVIS WITH CONTRAST TECHNIQUE: Multidetector CT imaging of the abdomen and pelvis was performed using the standard protocol following bolus administration of intravenous contrast. CONTRAST:  ISOVUE-300 IOPAMIDOL (ISOVUE-300) INJECTION 61% COMPARISON:  June 08, 2014 FINDINGS: Lower chest: No acute abnormality. Hepatobiliary: No focal liver abnormality is seen. No gallstones, gallbladder wall thickening, or biliary dilatation. Pancreas: Unremarkable. No pancreatic ductal dilatation or surrounding inflammatory changes. Spleen: Normal in size without focal abnormality. Adrenals/Urinary Tract: The adrenal glands are normal. There appears to be a small amount of contrast in the kidneys on initial imaging which limits evaluation for underlying stones. No obstructing stones are noted. The ureters and bladder are normal. Stomach/Bowel: The stomach and small bowel are normal. Colonic diverticulosis is identified without diverticulitis. The colon is otherwise normal. The appendix is dilated, measuring 14 mm in greatest dimension with mild thickening proximally and adjacent stranding. There is a 5 mm appendicolith proximally. Appendix: Location: Right lower quadrant Diameter: 14 mm Appendicolith: 5 mm proximal appendicolith Mucosal hyper-enhancement: Mild Extraluminal gas: No Periappendiceal collection: No Vascular/Lymphatic: No significant vascular findings are present. No enlarged abdominal or pelvic lymph nodes. Reproductive: Prostate is unremarkable. Other: No free air or fluid collection. Musculoskeletal: No acute or significant osseous findings. IMPRESSION: 1. Appendicitis as above with no evidence of  perforation. A 5 mm proximal appendicolith is identified. 2. No other acute abnormalities. Electronically Signed   By: Gerome Sam III M.D   On: 11/27/2017 17:18   Pertinent labs & imaging results that were available during my care of the patient were reviewed by me and considered in my medical decision making (see chart for details).  Medications Ordered in ED Medications  oxyCODONE-acetaminophen (PERCOCET/ROXICET) 5-325 MG per tablet 1 tablet (1 tablet Oral Given 11/27/17 1305)  piperacillin-tazobactam (ZOSYN) IVPB 3.375 g (not administered)  piperacillin-tazobactam (ZOSYN) IVPB 3.375 g (not administered)  ondansetron (ZOFRAN-ODT) disintegrating tablet 4 mg (4 mg Oral Given 11/27/17 1305)  HYDROmorphone (DILAUDID) injection 1 mg (1 mg Intravenous Given 11/27/17 1552)  sodium chloride 0.9 % bolus 1,000 mL (1,000 mLs Intravenous New Bag/Given 11/27/17 1555)  ondansetron (ZOFRAN) injection 4 mg (4 mg Intravenous Given 11/27/17 1552)  iopamidol (ISOVUE-300) 61 % injection (100 mLs  Contrast Given 11/27/17 1633)                                                                                                                                    Procedures Procedures  (including critical care time)  Medical Decision Making / ED Course I have  reviewed the nursing notes for this encounter and the patient's prior records (if available in EHR or on provided paperwork).  Clinical Course as of Nov 27 1740  Wynelle Link Nov 27, 2017  1722 Patient presents with lower abdominal and right lower quadrant abdominal pain.  On exam patient has tenderness at McBurney's point.  Labs with leukocytosis.  UA negative.  CT scan revealed appendicitis.  Will touch base with surgery for admission and further management.  [PC]    Clinical Course User Index [PC] Cardama, Amadeo Garnet, MD     Final Clinical Impression(s) / ED Diagnoses Final diagnoses:  Appendicitis with peritonitis      This chart was dictated using voice  recognition software.  Despite best efforts to proofread,  errors can occur which can change the documentation meaning.   Nira Conn, MD 11/27/17 1742

## 2017-11-27 NOTE — Op Note (Signed)
Appendectomy, Lap, Procedure Note  Indications: The patient presented with a history of right-sided abdominal pain. A CT revealed findings consistent with acute appendicitis.  Pre-operative Diagnosis: acute appendicitis  Post-operative Diagnosis: Same  Surgeon: Abigail MiyamotoBLACKMAN,Thao Vanover A   Assistants: 0  Anesthesia: General endotracheal anesthesia  ASA Class: 2  Procedure Details  The patient was seen again in the Holding Room. The risks, benefits, complications, treatment options, and expected outcomes were discussed with the patient and/or family. The possibilities of reaction to medication, perforation of viscus, bleeding, recurrent infection, finding a normal appendix, the need for additional procedures, failure to diagnose a condition, and creating a complication requiring transfusion or operation were discussed. There was concurrence with the proposed plan and informed consent was obtained. The site of surgery was properly noted. The patient was taken to Operating Room, identified as Jimmy Santos and the procedure verified as Appendectomy. A Time Out was held and the above information confirmed.  The patient was placed in the supine position and general anesthesia was induced, along with placement of orogastric tube, Venodyne boots, and a Foley catheter. The abdomen was prepped and draped in a sterile fashion. A one centimeter infraumbilical incision was made.  The  midline fascia was incised with a #11 blade.  A Kelly clamp was used to confirm entrance into the peritoneal cavity.  A pursestring suture was passed around the incision with a 0 Vicryl.  The Hasson was introduced into the abdomen and the tails of the suture were used to hold the Hasson in place.   The pneumoperitoneum was then established to steady pressure of 15 mmHg.  Additional 5 mm cannulas then placed in the left lower quadrant of the abdomen and the right upper quadrant under direct visualization. A careful evaluation of the  entire abdomen was carried out. The patient was placed in Trendelenburg and left lateral decubitus position. The small intestines were retracted in the cephalad and left lateral direction away from the pelvis and right lower quadrant. The patient was found to have an enlarged and inflamed appendix that was extending into the pelvis. There was no evidence of perforation.  The appendix was carefully dissected. The appendix was was skeletonized with the harmonic scalpel.   The appendix was divided at its base using an endo-GIA stapler. Minimal appendiceal stump was left in place. There was no evidence of bleeding, leakage, or complication after division of the appendix. Irrigation was also performed and irrigate suctioned from the abdomen as well.  The umbilical port site was closed with the purse string suture. There was no residual palpable fascial defect.  The trocar site skin wounds were closed with 4-0 Monocryl.  Instrument, sponge, and needle counts were correct at the conclusion of the case.   Findings: The appendix was found to be inflamed. There were not signs of necrosis.  There was not perforation. There was not abscess formation.  Estimated Blood Loss:  Minimal         Drains:none         Complications:  None; patient tolerated the procedure well.         Disposition: PACU - hemodynamically stable.         Condition: stable

## 2017-11-27 NOTE — ED Notes (Signed)
Pt ambulatory to bathroom with steady gait at this time .

## 2017-11-27 NOTE — Progress Notes (Signed)
Notified anesthesia MD BP MAPs in low 100's.  MD ok with current BPs pt didn't take his AM dose of BP medication.

## 2017-11-27 NOTE — ED Notes (Signed)
Pt states no relief with nausea and pain medication.

## 2017-11-27 NOTE — ED Triage Notes (Addendum)
Pt states suprapubic pain, diarrhea, vomiting and decreased urination x 18 hours.  Pt states took new herbal supplement yesterday that kept him up all night.

## 2017-11-27 NOTE — H&P (Signed)
Jimmy Santos is an 62 y.o. male.   Chief Complaint: Right lower quadrant abdominal pain HPI: This is a pleasant 62 year old gentleman who presents with a 1 day history of right lower quadrant abdominal pain.  The pain came on moderately.  It is now sharp and moderate to severe in intensity.  He had some loose bowel movement and nausea with this but no emesis.  He denies fevers or chills.  He has no previous similar history of abdominal pain.  He presented to the emergency department.  A CT scan showed findings consistent with acute appendicitis with an appendicolith. The pain does not refer anywhere else.  He is otherwise healthy without complaints.  Past Medical History:  Diagnosis Date  . ADD (attention deficit disorder)   . Asthma   . Diverticulosis   . Hypercholesteremia   . Hypertension     Past Surgical History:  Procedure Laterality Date  . COLONOSCOPY  2014  . EYE SURGERY     LASIK   . RETINAL DETACHMENT SURGERY Right 2015    No family history on file. Social History:  reports that  has never smoked. he has never used smokeless tobacco. He reports that he drinks about 1.2 oz of alcohol per week. He reports that he does not use drugs.  Allergies:  Allergies  Allergen Reactions  . Aspirin Rash     (Not in a hospital admission)  Results for orders placed or performed during the hospital encounter of 11/27/17 (from the past 48 hour(s))  Lipase, blood     Status: None   Collection Time: 11/27/17  1:00 PM  Result Value Ref Range   Lipase 31 11 - 51 U/L  Comprehensive metabolic panel     Status: Abnormal   Collection Time: 11/27/17  1:00 PM  Result Value Ref Range   Sodium 134 (L) 135 - 145 mmol/L   Potassium 4.1 3.5 - 5.1 mmol/L   Chloride 98 (L) 101 - 111 mmol/L   CO2 23 22 - 32 mmol/L   Glucose, Bld 99 65 - 99 mg/dL   BUN 22 (H) 6 - 20 mg/dL   Creatinine, Ser 1.18 0.61 - 1.24 mg/dL   Calcium 9.6 8.9 - 10.3 mg/dL   Total Protein 7.7 6.5 - 8.1 g/dL   Albumin  4.7 3.5 - 5.0 g/dL   AST 57 (H) 15 - 41 U/L   ALT 70 (H) 17 - 63 U/L   Alkaline Phosphatase 61 38 - 126 U/L   Total Bilirubin 1.6 (H) 0.3 - 1.2 mg/dL   GFR calc non Af Amer >60 >60 mL/min   GFR calc Af Amer >60 >60 mL/min    Comment: (NOTE) The eGFR has been calculated using the CKD EPI equation. This calculation has not been validated in all clinical situations. eGFR's persistently <60 mL/min signify possible Chronic Kidney Disease.    Anion gap 13 5 - 15  CBC     Status: Abnormal   Collection Time: 11/27/17  1:00 PM  Result Value Ref Range   WBC 13.5 (H) 4.0 - 10.5 K/uL   RBC 5.46 4.22 - 5.81 MIL/uL   Hemoglobin 16.3 13.0 - 17.0 g/dL   HCT 48.1 39.0 - 52.0 %   MCV 88.1 78.0 - 100.0 fL   MCH 29.9 26.0 - 34.0 pg   MCHC 33.9 30.0 - 36.0 g/dL   RDW 12.9 11.5 - 15.5 %   Platelets 149 (L) 150 - 400 K/uL  Urinalysis, Routine w reflex microscopic  Status: Abnormal   Collection Time: 11/27/17  3:20 PM  Result Value Ref Range   Color, Urine YELLOW YELLOW   APPearance CLEAR CLEAR   Specific Gravity, Urine 1.028 1.005 - 1.030   pH 5.0 5.0 - 8.0   Glucose, UA NEGATIVE NEGATIVE mg/dL   Hgb urine dipstick NEGATIVE NEGATIVE   Bilirubin Urine NEGATIVE NEGATIVE   Ketones, ur 80 (A) NEGATIVE mg/dL   Protein, ur 100 (A) NEGATIVE mg/dL   Nitrite NEGATIVE NEGATIVE   Leukocytes, UA NEGATIVE NEGATIVE   RBC / HPF 0-5 0 - 5 RBC/hpf   WBC, UA 0-5 0 - 5 WBC/hpf   Bacteria, UA NONE SEEN NONE SEEN   Squamous Epithelial / LPF NONE SEEN NONE SEEN   Mucus PRESENT    Hyaline Casts, UA PRESENT    Ct Abdomen Pelvis W Contrast  Result Date: 11/27/2017 CLINICAL DATA:  Lower abdominal pain for 18 hours. EXAM: CT ABDOMEN AND PELVIS WITH CONTRAST TECHNIQUE: Multidetector CT imaging of the abdomen and pelvis was performed using the standard protocol following bolus administration of intravenous contrast. CONTRAST:  11m ISOVUE-300 IOPAMIDOL (ISOVUE-300) INJECTION 61% COMPARISON:  June 08, 2014 FINDINGS:  Lower chest: No acute abnormality. Hepatobiliary: No focal liver abnormality is seen. No gallstones, gallbladder wall thickening, or biliary dilatation. Pancreas: Unremarkable. No pancreatic ductal dilatation or surrounding inflammatory changes. Spleen: Normal in size without focal abnormality. Adrenals/Urinary Tract: The adrenal glands are normal. There appears to be a small amount of contrast in the kidneys on initial imaging which limits evaluation for underlying stones. No obstructing stones are noted. The ureters and bladder are normal. Stomach/Bowel: The stomach and small bowel are normal. Colonic diverticulosis is identified without diverticulitis. The colon is otherwise normal. The appendix is dilated, measuring 14 mm in greatest dimension with mild thickening proximally and adjacent stranding. There is a 5 mm appendicolith proximally. Appendix: Location: Right lower quadrant Diameter: 14 mm Appendicolith: 5 mm proximal appendicolith Mucosal hyper-enhancement: Mild Extraluminal gas: No Periappendiceal collection: No Vascular/Lymphatic: No significant vascular findings are present. No enlarged abdominal or pelvic lymph nodes. Reproductive: Prostate is unremarkable. Other: No free air or fluid collection. Musculoskeletal: No acute or significant osseous findings. IMPRESSION: 1. Appendicitis as above with no evidence of perforation. A 5 mm proximal appendicolith is identified. 2. No other acute abnormalities. Electronically Signed   By: DDorise BullionIII M.D   On: 11/27/2017 17:18    Review of Systems  Constitutional: Negative for chills and fever.  Respiratory: Negative for cough and shortness of breath.   Cardiovascular: Negative for chest pain.  Gastrointestinal: Positive for abdominal pain, diarrhea and nausea. Negative for vomiting.  Genitourinary: Negative for dysuria.    Blood pressure (!) 161/86, pulse 64, temperature 98.1 F (36.7 C), temperature source Oral, resp. rate 16, height '5\' 11"'   (1.803 m), weight 89.4 kg (197 lb), SpO2 100 %. Physical Exam  Constitutional: He is oriented to person, place, and time. He appears well-developed and well-nourished.  Uncomfortable in appearance  HENT:  Head: Normocephalic and atraumatic.  Right Ear: External ear normal.  Left Ear: External ear normal.  Nose: Nose normal.  Mouth/Throat: No oropharyngeal exudate.  Eyes: Conjunctivae are normal. Pupils are equal, round, and reactive to light. Right eye exhibits no discharge. Left eye exhibits no discharge. No scleral icterus.  Neck: Normal range of motion. No tracheal deviation present.  Cardiovascular: Normal rate, regular rhythm, normal heart sounds and intact distal pulses.  No murmur heard. Respiratory: Effort normal and breath sounds normal.  No respiratory distress. He has no wheezes.  GI: Soft. There is tenderness. There is guarding.  Tenderness with guarding in the right lower quadrant  Musculoskeletal: Normal range of motion. He exhibits no edema or deformity.  Lymphadenopathy:    He has no cervical adenopathy.  Neurological: He is alert and oriented to person, place, and time.  Skin: Skin is warm and dry. No erythema.  Psychiatric: His behavior is normal. Judgment normal.     Assessment/Plan Acute appendicitis  I discussed the diagnosis with the patient and his family.  Appendectomy is recommended given the acute appendicitis with an appendicolith.  I discussed laparoscopic appendectomy in detail.  I discussed the risks which includes but is not limited to bleeding, infection, appendiceal stump leak, injury to other structures, the need to convert to an open procedure, the need for other procedures, cardiopulmonary issues, DVT, etc.  We also discussed postoperative recovery.  They are in agreement with the plans.  Surgery is scheduled.  Harl Bowie, MD 11/27/2017, 6:21 PM

## 2017-11-27 NOTE — Transfer of Care (Signed)
Immediate Anesthesia Transfer of Care Note  Patient: Raynelle Highlandharles Lites  Procedure(s) Performed: APPENDECTOMY LAPAROSCOPIC (N/A Abdomen)  Patient Location: PACU  Anesthesia Type:General  Level of Consciousness: awake, oriented and patient cooperative  Airway & Oxygen Therapy: Patient Spontanous Breathing and Patient connected to nasal cannula oxygen  Post-op Assessment: Report given to RN, Post -op Vital signs reviewed and stable and Patient moving all extremities X 4  Post vital signs: Reviewed and stable  Last Vitals:  Vitals:   11/27/17 1815 11/27/17 1953  BP:  (!) (P) 159/94  Pulse: 66 (P) 65  Resp:  (P) 19  Temp:  (P) 36.7 C  SpO2: 99% (P) 98%    Last Pain:  Vitals:   11/27/17 1810  TempSrc:   PainSc: 3          Complications: No apparent anesthesia complications

## 2017-11-27 NOTE — Anesthesia Procedure Notes (Signed)
Procedure Name: Intubation Date/Time: 11/27/2017 7:10 PM Performed by: Claris Che, CRNA Pre-anesthesia Checklist: Patient identified, Emergency Drugs available, Suction available, Patient being monitored and Timeout performed Patient Re-evaluated:Patient Re-evaluated prior to induction Oxygen Delivery Method: Circle system utilized Preoxygenation: Pre-oxygenation with 100% oxygen Induction Type: IV induction, Rapid sequence and Cricoid Pressure applied Laryngoscope Size: Mac and 4 Grade View: Grade III Tube type: Oral Tube size: 8.0 mm Number of attempts: 1 Airway Equipment and Method: Stylet Placement Confirmation: ETT inserted through vocal cords under direct vision,  positive ETCO2 and breath sounds checked- equal and bilateral Secured at: 24 cm Tube secured with: Tape Dental Injury: Teeth and Oropharynx as per pre-operative assessment

## 2017-11-27 NOTE — ED Notes (Signed)
Patient transported to CT 

## 2017-11-27 NOTE — Progress Notes (Signed)
Pharmacy Antibiotic Note Jimmy Santos is a 62 y.o. male admitted on 11/27/2017 with acute appendicitis. Pharmacy has been consulted for Zosyn dosing.  Plan: Zosyn 3.375g IV q8h (4 hour infusion).  Height: 5\' 11"  (180.3 cm) Weight: 197 lb (89.4 kg) IBW/kg (Calculated) : 75.3  Temp (24hrs), Avg:98.1 F (36.7 C), Min:98.1 F (36.7 C), Max:98.1 F (36.7 C)  Recent Labs  Lab 11/27/17 1300  WBC 13.5*  CREATININE 1.18    Estimated Creatinine Clearance: 71.8 mL/min (by C-G formula based on SCr of 1.18 mg/dL).    Allergies  Allergen Reactions  . Aspirin Rash      Jimmy Santos 11/27/2017 5:47 PM

## 2017-11-28 ENCOUNTER — Encounter (HOSPITAL_COMMUNITY): Payer: Self-pay | Admitting: Surgery

## 2017-11-28 LAB — GLUCOSE, CAPILLARY: Glucose-Capillary: 133 mg/dL — ABNORMAL HIGH (ref 65–99)

## 2017-11-28 MED ORDER — OXYCODONE HCL 5 MG PO TABS: 5.0000 mg | ORAL_TABLET | ORAL | 0 refills | Status: DC | PRN

## 2017-11-28 NOTE — Discharge Summary (Signed)
Physician Discharge Summary  Patient ID: Jimmy Santos MRN: [id number] DOB/AGE: Nov 25, 1955 62 y.o.  Admit date: 11/27/2017 Discharge date: 11/28/2017  Admission Diagnoses:  Acute appendicitis  Discharge Diagnoses: same Active Problems:   Acute appendicitis   Discharged Condition: good  Hospital Course: Admitted via ED on 11/27/17 for acute appendicitis.  Underwent laparoscopic appendectomy by Dr. Magnus IvanBlackman - no sign of perforation  Consults: None  Significant Diagnostic Studies: labs:  Lab Results  Component Value Date   WBC 13.5 (H) 11/27/2017   HGB 16.3 11/27/2017   HCT 48.1 11/27/2017   MCV 88.1 11/27/2017   PLT 149 (L) 11/27/2017   Lab Results  Component Value Date   CREATININE 1.18 11/27/2017   BUN 22 (H) 11/27/2017   NA 134 (L) 11/27/2017   K 4.1 11/27/2017   CL 98 (L) 11/27/2017   CO2 23 11/27/2017     Treatments: surgery: lap appendectomy  Discharge Exam: Blood pressure 137/82, pulse 62, temperature 98.1 F (36.7 C), temperature source Oral, resp. rate 18, height 5\' 11"  (1.803 m), weight 89.4 kg (197 lb), SpO2 94 %. General appearance: alert, cooperative and no distress GI: soft, mild incisional tenderness Incisions - Dermabond c/d/i  Disposition: 01-Home or Self Care  Discharge Instructions    Call MD for:  persistant nausea and vomiting   Complete by:  As directed    Call MD for:  redness, tenderness, or signs of infection (pain, swelling, redness, odor or green/yellow discharge around incision site)   Complete by:  As directed    Call MD for:  severe uncontrolled pain   Complete by:  As directed    Call MD for:  temperature >100.4   Complete by:  As directed    Diet general   Complete by:  As directed    Driving Restrictions   Complete by:  As directed    Do not drive while taking pain medications   Increase activity slowly   Complete by:  As directed    May shower / Bathe   Complete by:  As directed      Restart all home  meds    Follow-up Information    [facility] week([facility]).   Specialty:  General Surgery Why:  We are working on your appointment, please call to confirm. Please arrive 30 minutes prior to your appointment to check in and fill out paperwork. Bring insurance information and photo ID. Contact information: [address] [place] [id number] [phone]          Signed: Wynona LunaMatthew K Dail Lerew 11/28/2017, 7:51 AM

## 2017-11-28 NOTE — Discharge Instructions (Signed)

## 2019-04-26 IMAGING — CT CT ABD-PELV W/ CM
2 of 6 series · 16 of 46 positions shown, 18 images · IV contrast (APPLIED)
Comparison: June 08, 2014

CLINICAL DATA: Lower abdominal pain for 18 hours.

EXAM:
CT ABDOMEN AND PELVIS WITH CONTRAST
TECHNIQUE: Multidetector CT imaging of the abdomen and pelvis was performed
using the standard protocol following bolus administration of
intravenous contrast.
CONTRAST:  100mL QD8R3P-LDD IOPAMIDOL (QD8R3P-LDD) INJECTION 61%

[Series 4: abdomen 5.0 · axial · 0.67mm/px · z∈[-178,+247]mm · 13 of 101 slices shown, 15 images]
[im 8/101  soft-tissue]
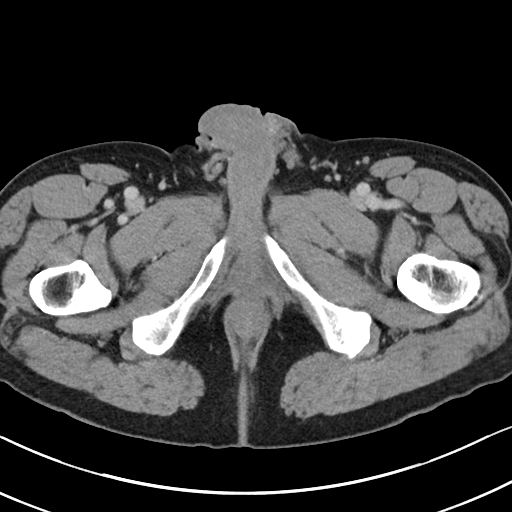
[im 8/101  bone]
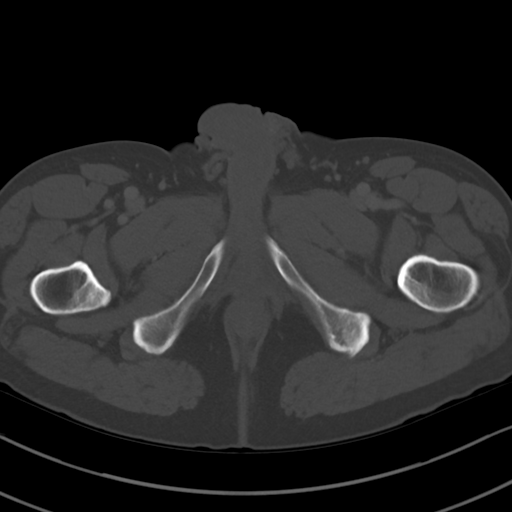
[im 15/101  soft-tissue]
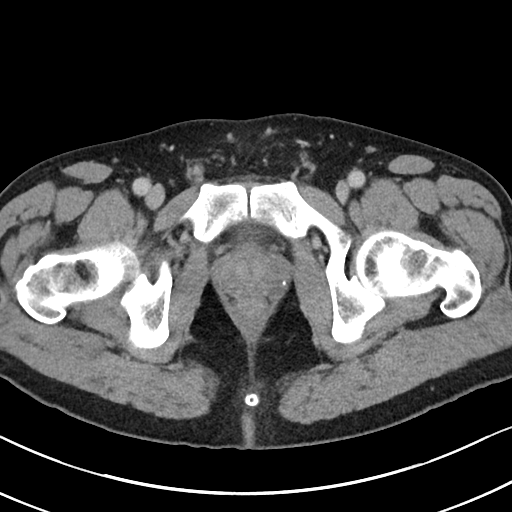
[im 22/101  soft-tissue]
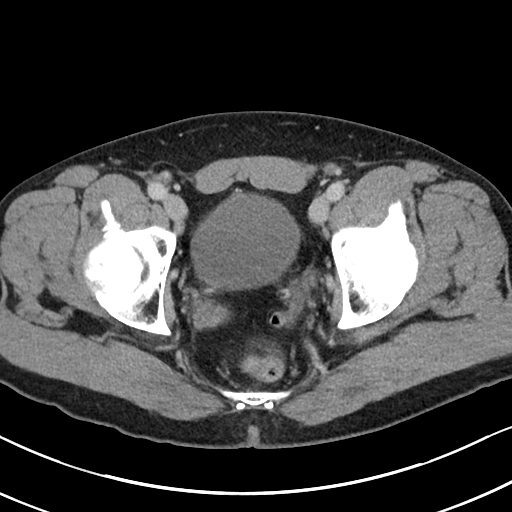
[im 29/101  soft-tissue]
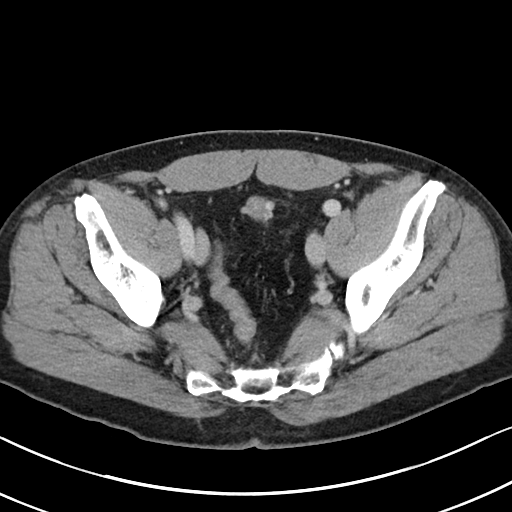
[im 36/101  soft-tissue]
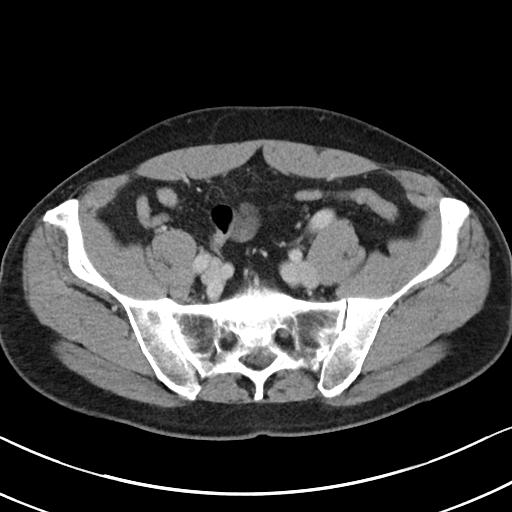
[im 43/101  soft-tissue]
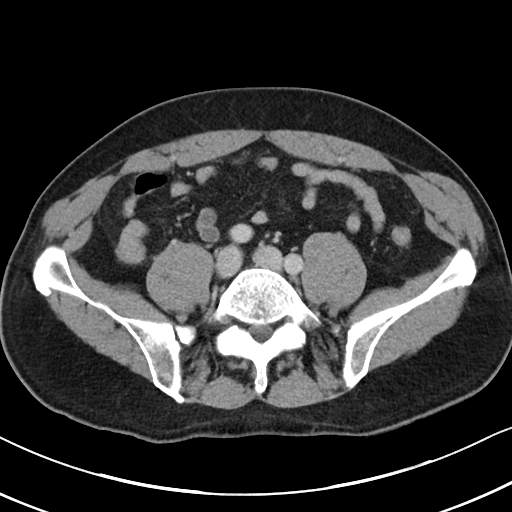
[im 51/101  soft-tissue]
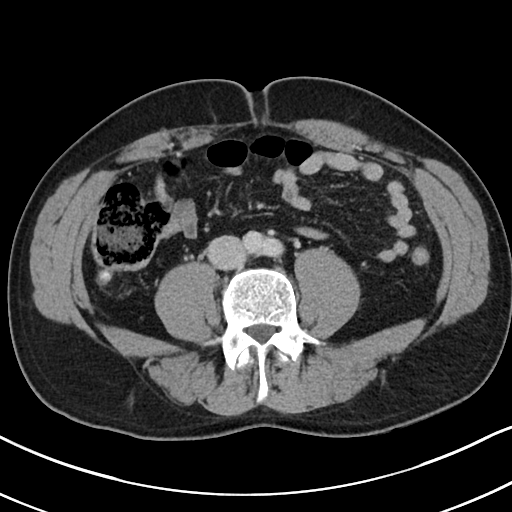
[im 58/101  soft-tissue]
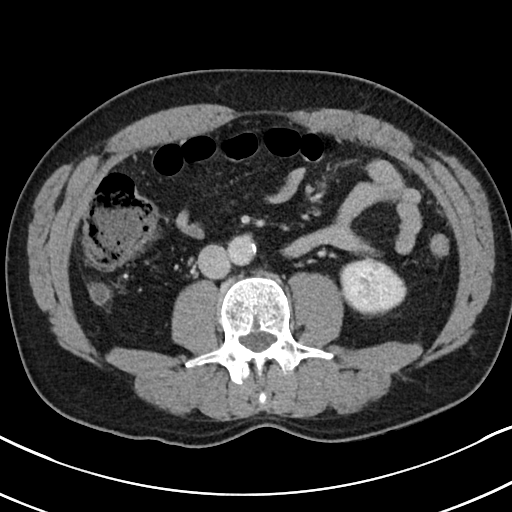
[im 65/101  soft-tissue]
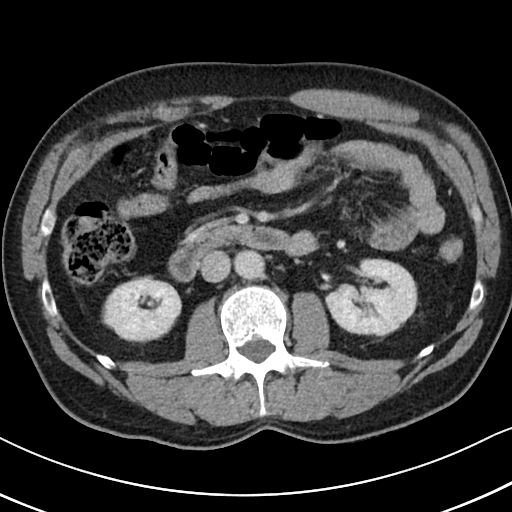
[im 65/101  bone]
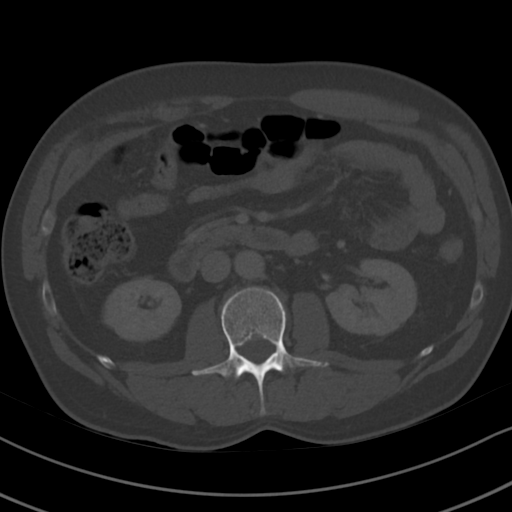
[im 72/101  soft-tissue]
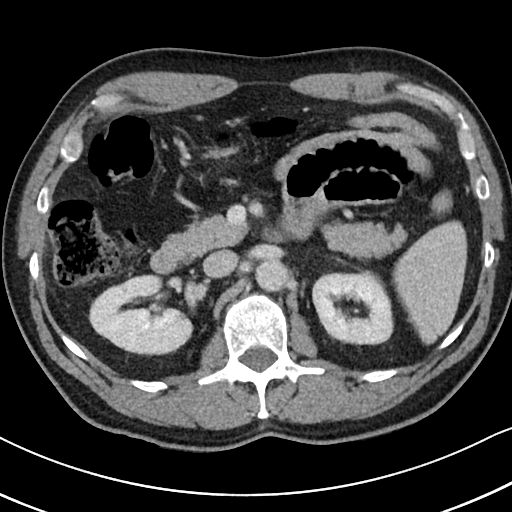
[im 79/101  soft-tissue]
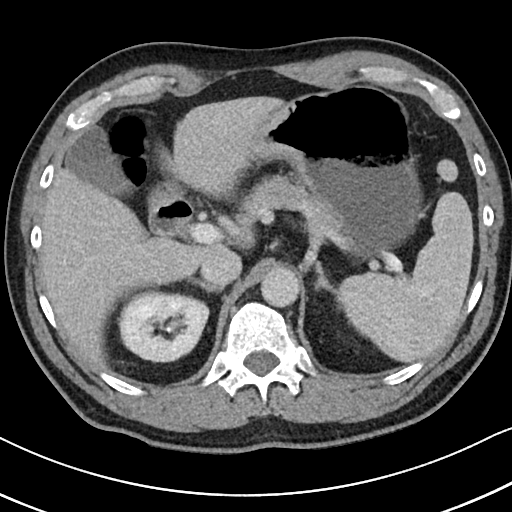
[im 86/101  soft-tissue]
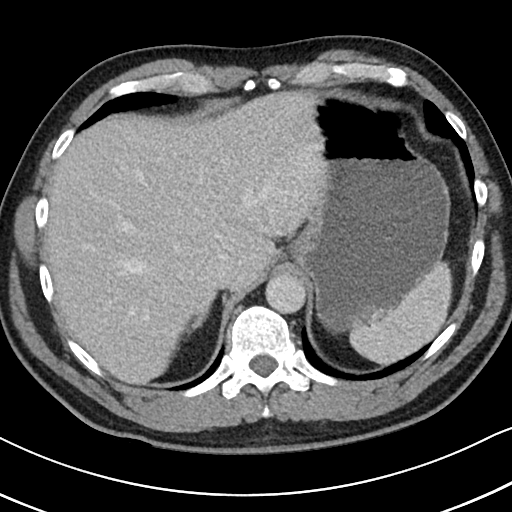
[im 93/101  soft-tissue]
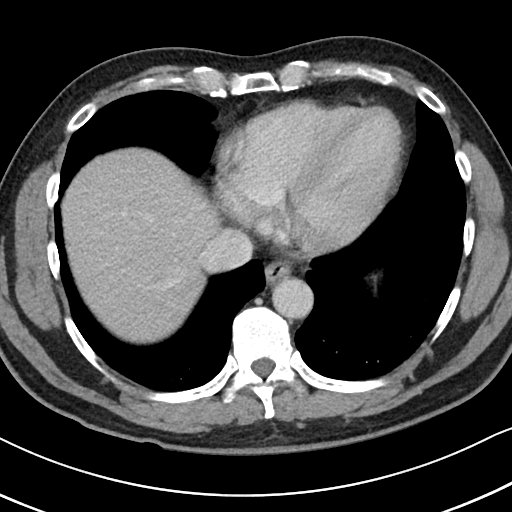

[Series 7: abdomen 3.0 mpr cor · coronal · 0.75mm/px · 3 of 100 slices shown]
[im 34/100  soft-tissue]
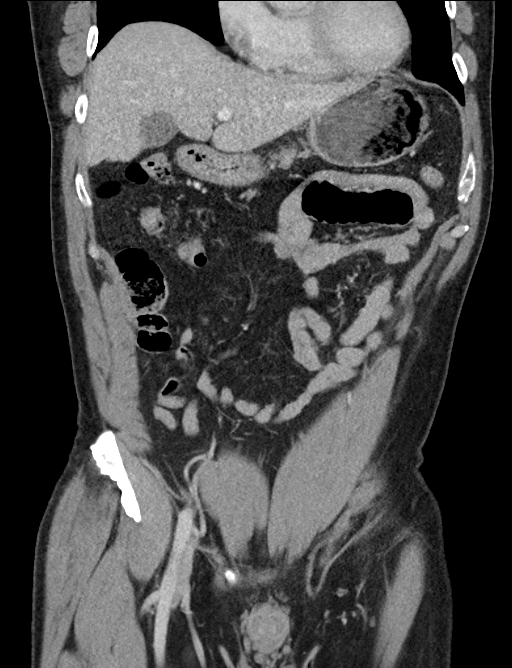
[im 45/100  soft-tissue]
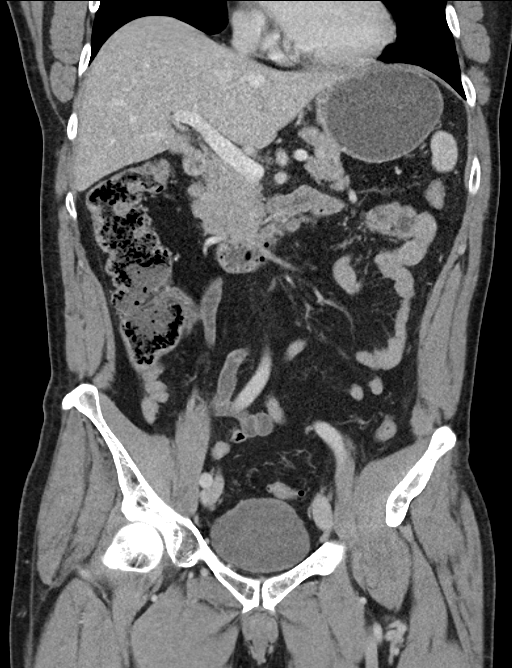
[im 56/100  soft-tissue]
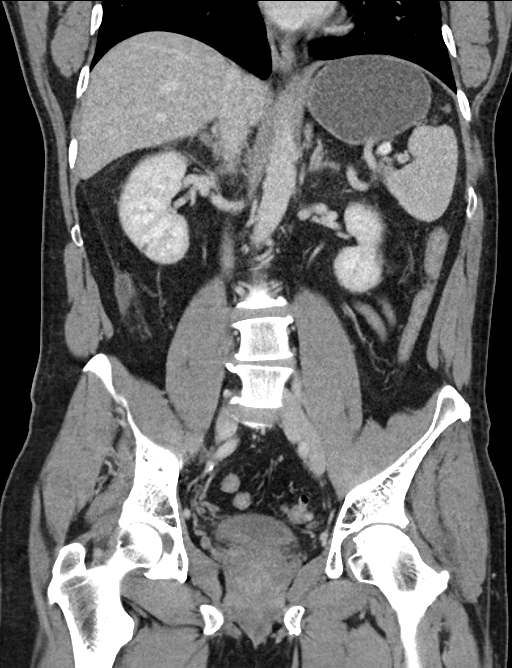

[16 of 46 positions shown; findings below may reference images not displayed]

FINDINGS: Lower chest: No acute abnormality.

Hepatobiliary: No focal liver abnormality is seen. No gallstones,
gallbladder wall thickening, or biliary dilatation.

Pancreas: Unremarkable. No pancreatic ductal dilatation or
surrounding inflammatory changes.

Spleen: Normal in size without focal abnormality.

Adrenals/Urinary Tract: The adrenal glands are normal. There appears
to be a small amount of contrast in the kidneys on initial imaging
which limits evaluation for underlying stones. No obstructing stones
are noted. The ureters and bladder are normal.

Stomach/Bowel: The stomach and small bowel are normal. Colonic
diverticulosis is identified without diverticulitis. The colon is
otherwise normal. The appendix is dilated, measuring 14 mm in
greatest dimension with mild thickening proximally and adjacent
stranding. There is a 5 mm appendicolith proximally.

Appendix: Location: Right lower quadrant

Diameter: 14 mm

Appendicolith: 5 mm proximal appendicolith

Mucosal hyper-enhancement: Mild

Extraluminal gas: No

Periappendiceal collection: No

Vascular/Lymphatic: No significant vascular findings are present. No
enlarged abdominal or pelvic lymph nodes.

Reproductive: Prostate is unremarkable.

Other: No free air or fluid collection.

Musculoskeletal: No acute or significant osseous findings.
IMPRESSION: 1. Appendicitis as above with no evidence of perforation. A 5 mm
proximal appendicolith is identified.
2. No other acute abnormalities.

## 2019-09-14 ENCOUNTER — Other Ambulatory Visit: Payer: Self-pay

## 2019-09-14 DIAGNOSIS — Z20822 Contact with and (suspected) exposure to covid-19: Secondary | ICD-10-CM

## 2019-09-15 LAB — NOVEL CORONAVIRUS, NAA: SARS-CoV-2, NAA: DETECTED — AB

## 2020-02-22 ENCOUNTER — Ambulatory Visit: Payer: Self-pay | Attending: Internal Medicine

## 2020-02-22 DIAGNOSIS — Z23 Encounter for immunization: Secondary | ICD-10-CM

## 2020-02-22 NOTE — Progress Notes (Signed)
   Covid-19 Vaccination Clinic  Name:  Eliyohu Class    MRN: [id number] DOB: 1956-05-12  02/22/2020  Mr. Raether was observed post Covid-19 immunization for 15 minutes without incident. He was provided with Vaccine Information Sheet and instruction to access the V-Safe system.   Mr. Kosik was instructed to call 911 with any severe reactions post vaccine: Marland Kitchen Difficulty breathing  . Swelling of face and throat  . A fast heartbeat  . A bad rash all over body  . Dizziness and weakness   Immunizations Administered    Name Date Dose VIS Date Route   Pfizer COVID-19 Vaccine 02/22/2020  2:16 PM 0.3 mL 11/02/2019 Intramuscular   Manufacturer: [employer], [employer]   Lot: [id number]   NDC: [address]-0

## 2020-03-17 ENCOUNTER — Ambulatory Visit: Payer: Self-pay | Attending: Internal Medicine

## 2020-03-17 DIAGNOSIS — Z23 Encounter for immunization: Secondary | ICD-10-CM

## 2020-03-17 NOTE — Progress Notes (Signed)
   Covid-19 Vaccination Clinic  Name:  Jimmy Santos    MRN: [id number] DOB: 09/03/1956  03/17/2020  Jimmy Santos was observed post Covid-19 immunization for 15 minutes without incident. He was provided with Vaccine Information Sheet and instruction to access the V-Safe system.   Jimmy Santos was instructed to call 911 with any severe reactions post vaccine: Marland Kitchen Difficulty breathing  . Swelling of face and throat  . A fast heartbeat  . A bad rash all over body  . Dizziness and weakness   Immunizations Administered    Name Date Dose VIS Date Route   Pfizer COVID-19 Vaccine 03/17/2020  2:24 PM 0.3 mL 01/16/2019 Intramuscular   Manufacturer: [employer], [employer]   Lot: [id number]   NDC: [address]-4

## 2022-07-05 ENCOUNTER — Encounter: Payer: Self-pay | Admitting: Internal Medicine

## 2022-07-05 ENCOUNTER — Ambulatory Visit: Payer: No Typology Code available for payment source | Admitting: Internal Medicine

## 2022-07-05 VITALS — BP 168/94 | HR 39 | Temp 97.8°F | Resp 16 | Ht 71.0 in | Wt 199.2 lb

## 2022-07-05 DIAGNOSIS — R55 Syncope and collapse: Secondary | ICD-10-CM

## 2022-07-05 DIAGNOSIS — I10 Essential (primary) hypertension: Secondary | ICD-10-CM

## 2022-07-05 DIAGNOSIS — R001 Bradycardia, unspecified: Secondary | ICD-10-CM

## 2022-07-05 MED ORDER — LOSARTAN POTASSIUM 50 MG PO TABS: 50.0000 mg | ORAL_TABLET | Freq: Every day | ORAL | 2 refills | Status: DC

## 2022-07-05 MED ORDER — AMLODIPINE BESYLATE 5 MG PO TABS: 5.0000 mg | ORAL_TABLET | Freq: Every day | ORAL | 2 refills | Status: DC

## 2022-07-05 MED ORDER — ROSUVASTATIN CALCIUM 20 MG PO TABS: 20.0000 mg | ORAL_TABLET | Freq: Every day | ORAL | 2 refills | Status: DC

## 2022-07-05 NOTE — Progress Notes (Signed)
Primary Physician/Referring:  Catha Gosselin, MD  Patient ID: Jimmy Santos, male    DOB: 1956-02-08, 66 y.o.   MRN: [id number]  Chief Complaint  Patient presents with   New Patient (Initial Visit)   HPI:    Jimmy Santos  is a 66 y.o. male with past medical history significant for bradycardia, HTN, and recent syncopal episodes. Patient is here to establish care with cardiology. He has had very low heart rates over the last few months which have been causing him to feel fatigued.  Given the severity of his bradycardia patient could very well be in sick sinus syndrome or high degree heart block.  This has never happened to him before.  He denies any known cardiac history in himself.  He does not smoke or drink alcohol.  While exercising recently he has issues getting his heart rate up and he feels awful.  Patient has never had heart rate low like this before.  He is not on any medications that cause low heart rate.  He does not take any supplements that would cause this either.  He has never had an echocardiogram in the past.  He has had an equivocal stress test in the last 2 years.  Today he denies chest pain, shortness of breath, diaphoresis, edema, PND, orthopnea.  Past Medical History:  Diagnosis Date   ADD (attention deficit disorder)    Asthma    Diverticulosis    Hypercholesteremia    Hypertension    Past Surgical History:  Procedure Laterality Date   COLONOSCOPY  2014   EYE SURGERY     LASIK    LAPAROSCOPIC APPENDECTOMY N/A 11/27/2017   Procedure: APPENDECTOMY LAPAROSCOPIC;  Surgeon: Abigail Miyamoto, MD;  Location: [facility] OR;  Service: General;  Laterality: N/A;   RETINAL DETACHMENT SURGERY Right 2015   Family History  Problem Relation Age of Onset   Hypertension Father     Social History   Tobacco Use   Smoking status: Never   Smokeless tobacco: Never  Substance Use Topics   Alcohol use: Yes    Alcohol/week: 2.0 standard drinks of alcohol    Types: 2 Glasses of wine  per week   Marital Status: Married  ROS  Review of Systems  Cardiovascular:  Positive for irregular heartbeat, near-syncope, palpitations and syncope.  Neurological:  Positive for light-headedness.  All other systems reviewed and are negative. Objective  Blood pressure (!) 168/94, pulse (!) 39, temperature 97.8 F (36.6 C), temperature source Temporal, resp. rate 16, height 5\' 11"  (1.803 m), weight 199 lb 3.2 oz (90.4 kg), SpO2 98 %. Body mass index is 27.78 kg/m.     07/05/2022    3:40 PM 07/05/2022    3:30 PM 11/28/2017    8:40 AM  Vitals with BMI  Height  5\' 11"    Weight  199 lbs 3 oz   BMI  27.8   Systolic 168 175 01/26/2018  Diastolic 94 90 76  Pulse 39 40 59     Physical Exam Constitutional:      Appearance: Normal appearance. He is normal weight.  HENT:     Head: Normocephalic and atraumatic.  Neck:     Vascular: No carotid bruit.  Cardiovascular:     Rate and Rhythm: Regular rhythm. Bradycardia present.     Pulses: Normal pulses.     Heart sounds: Normal heart sounds. No murmur heard.    No friction rub. No gallop.  Pulmonary:     Effort: Pulmonary effort is  normal.     Breath sounds: Normal breath sounds.  Abdominal:     General: Abdomen is flat. Bowel sounds are normal.     Palpations: Abdomen is soft.  Musculoskeletal:     Right lower leg: No edema.     Left lower leg: No edema.  Skin:    General: Skin is warm and dry.  Neurological:     Mental Status: He is alert.   Medications and allergies   Allergies  Allergen Reactions   Ibuprofen Anaphylaxis   Aspirin Rash     Medication list after today's encounter   Current Outpatient Medications:    acetaminophen (TYLENOL) 325 MG tablet, Take 650 mg by mouth every 6 (six) hours as needed for mild pain or moderate pain., Disp: , Rfl:    amLODipine (NORVASC) 5 MG tablet, Take 1 tablet (5 mg total) by mouth daily., Disp: 30 tablet, Rfl: 2   amphetamine-dextroamphetamine (ADDERALL) 10 MG tablet, Take 20 mg by mouth  daily. , Disp: , Rfl:    fluticasone (FLONASE) 50 MCG/ACT nasal spray, Place 2 sprays into both nostrils daily as needed., Disp: , Rfl:    loratadine (CLARITIN) 10 MG tablet, Take 10 mg by mouth daily., Disp: , Rfl:    losartan (COZAAR) 50 MG tablet, Take 1 tablet (50 mg total) by mouth at bedtime., Disp: 30 tablet, Rfl: 2   ondansetron (ZOFRAN) 4 MG tablet, Take 1 tablet (4 mg total) by mouth every 6 (six) hours., Disp: 20 tablet, Rfl: 0   oxyCODONE (OXY IR/ROXICODONE) 5 MG immediate release tablet, Take 1 tablet (5 mg total) by mouth every 4 (four) hours as needed for moderate pain., Disp: 12 tablet, Rfl: 0   rosuvastatin (CRESTOR) 20 MG tablet, Take 1 tablet (20 mg total) by mouth daily., Disp: 30 tablet, Rfl: 2   VENTOLIN HFA 108 (90 BASE) MCG/ACT inhaler, Inhale 2 puffs into the lungs every 4 (four) hours as needed., Disp: , Rfl:   Laboratory examination:   Lab Results  Component Value Date   NA 134 (L) 11/27/2017   K 4.1 11/27/2017   CO2 23 11/27/2017   GLUCOSE 99 11/27/2017   BUN 22 (H) 11/27/2017   CREATININE 1.18 11/27/2017   CALCIUM 9.6 11/27/2017   GFRNONAA >60 11/27/2017       Latest Ref Rng & Units 11/27/2017    1:00 PM 06/08/2014    4:42 PM 07/01/2010    8:45 AM  CMP  Glucose 65 - 99 mg/dL 99  108  85   BUN 6 - 20 mg/dL 22  18  27    Creatinine 0.61 - 1.24 mg/dL 1.18  1.59  1.2   Sodium 135 - 145 mmol/L 134  139  140   Potassium 3.5 - 5.1 mmol/L 4.1  3.5  3.9   Chloride 101 - 111 mmol/L 98  95  101   CO2 22 - 32 mmol/L 23  27  30    Calcium 8.9 - 10.3 mg/dL 9.6  9.8  9.5   Total Protein 6.5 - 8.1 g/dL 7.7  7.4  6.4   Total Bilirubin 0.3 - 1.2 mg/dL 1.6  0.8  0.9   Alkaline Phos 38 - 126 U/L 61  59  50   AST 15 - 41 U/L 57  23  29   ALT 17 - 63 U/L 70  28  31       Latest Ref Rng & Units 11/27/2017    1:00 PM 06/08/2014  4:42 PM 07/01/2010    8:45 AM  CBC  WBC 4.0 - 10.5 K/uL 13.5  9.2  6.8   Hemoglobin 13.0 - 17.0 g/dL 42.6  83.4  19.6   Hematocrit 39.0 - 52.0  % 48.1  41.7  39.5   Platelets 150 - 400 K/uL 149  155  169.0     Lipid Panel No results for input(s): "CHOL", "TRIG", "LDLCALC", "VLDL", "HDL", "CHOLHDL", "LDLDIRECT" in the last 8760 hours.  HEMOGLOBIN A1C No results found for: "HGBA1C", "MPG" TSH No results for input(s): "TSH" in the last 8760 hours.  External labs:     Radiology:    Cardiac Studies:   No results found for this or any previous visit from the past 1095 days.     No results found for this or any previous visit from the past 1095 days.     EKG:   07/05/22 EKG: marked sinus bradycardia, 38bpm    Assessment     ICD-10-CM   1. HYPERTENSION, BENIGN  I10 EKG 12-Lead    [facility] ECHOCARDIOGRAM COMPLETE    [facility] MYOCARDIAL PERFUSION WO LEXISCAN    LONG TERM MONITOR (3-14 DAYS)    2. Syncope and collapse  R55 [facility] ECHOCARDIOGRAM COMPLETE    [facility] MYOCARDIAL PERFUSION WO LEXISCAN    LONG TERM MONITOR (3-14 DAYS)    3. Bradycardia, severe sinus  R00.1 PCV ECHOCARDIOGRAM COMPLETE    [facility]V MYOCARDIAL PERFUSION WO LEXISCAN    LONG TERM MONITOR (3-14 DAYS)       Orders Placed This Encounter  Procedures   [facility] MYOCARDIAL PERFUSION WO LEXISCAN    Standing Status:   Future    Standing Expiration Date:   09/04/2022   LONG TERM MONITOR (3-14 DAYS)    Standing Status:   Future    Standing Expiration Date:   07/06/2023    Order Specific Question:   Where should this test be performed?    Answer:   [facility]    Order Specific Question:   Does the patient have an implanted cardiac device?    Answer:   No    Order Specific Question:   Prescribed days of wear    Answer:   17    Order Specific Question:   Type of enrollment    Answer:   Clinic Enrollment    Order Specific Question:   Release to patient    Answer:   Immediate   EKG 12-Lead   [facility]V ECHOCARDIOGRAM COMPLETE    Standing Status:   Future    Standing Expiration Date:   07/06/2023    Meds ordered this encounter  Medications   rosuvastatin (CRESTOR)  20 MG tablet    Sig: Take 1 tablet (20 mg total) by mouth daily.    Dispense:  30 tablet    Refill:  2   losartan (COZAAR) 50 MG tablet    Sig: Take 1 tablet (50 mg total) by mouth at bedtime.    Dispense:  30 tablet    Refill:  2   amLODipine (NORVASC) 5 MG tablet    Sig: Take 1 tablet (5 mg total) by mouth daily.    Dispense:  30 tablet    Refill:  2    Medications Discontinued During This Encounter  Medication Reason   CIALIS 20 MG tablet    quinapril-hydrochlorothiazide (ACCURETIC) 20-25 MG per tablet    atorvastatin (LIPITOR) 10 MG tablet    rosuvastatin (CRESTOR) 10 MG tablet      Recommendations:  Nachman Guttery is a 66 y.o.  M with symptomatic sinus bradycardia with syncope  Start Crestor 20mg , Losartan 50mg  at nighttime. Start amlodipine 5mg  every morning. Encourage low-sodium diet, less than 2000 mg daily. Schedule imaging tests in office - echo and stress test. 3-day event monitor ordered due to bradycardia -need to rule out high degree heart block or sick sinus syndrome given recurrent syncope Follow-up in 3 weeks or sooner if needed.     Floydene Flock, DO  07/05/2022, 3:48 PM Office: [phone] Pager: [phone]

## 2022-07-05 NOTE — Patient Instructions (Addendum)
Start Crestor 20mg , Losartan 50mg  at nighttime. Start amlodipine 5mg  every morning. Encourage low-sodium diet, less than 2000 mg daily. Schedule imaging tests in office - echo and stress test. 3-day event monitor ordered due to bradycardia. Follow-up in 3 weeks or sooner if needed.

## 2022-07-09 ENCOUNTER — Inpatient Hospital Stay: Payer: No Typology Code available for payment source

## 2022-07-09 DIAGNOSIS — R55 Syncope and collapse: Secondary | ICD-10-CM

## 2022-07-09 DIAGNOSIS — R001 Bradycardia, unspecified: Secondary | ICD-10-CM

## 2022-07-09 DIAGNOSIS — I10 Essential (primary) hypertension: Secondary | ICD-10-CM

## 2022-07-27 ENCOUNTER — Ambulatory Visit: Payer: No Typology Code available for payment source

## 2022-07-27 DIAGNOSIS — I10 Essential (primary) hypertension: Secondary | ICD-10-CM

## 2022-07-27 DIAGNOSIS — R55 Syncope and collapse: Secondary | ICD-10-CM

## 2022-07-27 DIAGNOSIS — R001 Bradycardia, unspecified: Secondary | ICD-10-CM

## 2022-08-05 ENCOUNTER — Ambulatory Visit: Payer: No Typology Code available for payment source

## 2022-08-05 DIAGNOSIS — R001 Bradycardia, unspecified: Secondary | ICD-10-CM

## 2022-08-05 DIAGNOSIS — R55 Syncope and collapse: Secondary | ICD-10-CM

## 2022-08-05 DIAGNOSIS — I10 Essential (primary) hypertension: Secondary | ICD-10-CM

## 2022-08-12 ENCOUNTER — Encounter: Payer: Self-pay | Admitting: Internal Medicine

## 2022-08-12 ENCOUNTER — Ambulatory Visit: Payer: No Typology Code available for payment source | Admitting: Internal Medicine

## 2022-08-12 VITALS — BP 175/94 | HR 44 | Temp 97.6°F | Resp 16 | Ht 71.0 in | Wt 197.0 lb

## 2022-08-12 DIAGNOSIS — I10 Essential (primary) hypertension: Secondary | ICD-10-CM

## 2022-08-12 DIAGNOSIS — R001 Bradycardia, unspecified: Secondary | ICD-10-CM | POA: Insufficient documentation

## 2022-08-12 NOTE — Progress Notes (Signed)
Primary Physician/Referring:  Hulan Fess, MD  Patient ID: Jimmy Santos, male    DOB: 15-Nov-1956, 66 y.o.   MRN: [id number]  Chief Complaint  Patient presents with   Hypertension   Follow-up    3 weeks.    HPI:    Jimmy Santos  is a 66 y.o. male with past medical history significant for bradycardia and HTN. Patient is here for 6 month follow-up.   During his last visit he was evaluated for bradycardia and elevated blood pressure. He was started on antihypertensive medications and completed echo, stress test, and cardiac event monitor. His heart rate has remained 40 bpm at rest without symptoms. He has not had any dizziness or syncopal episodes. He contributes previous symptoms and syncope to a medication that he was taking at this time.  He does monitor his blood pressure at home and readings are 135-140s/70s. He is tolerating medication without side effects. Today he denies chest pain, shortness of breath, diaphoresis, edema, PND, orthopnea. He is exercising daily at the gym and following a healthy diet.  Past Medical History:  Diagnosis Date   ADD (attention deficit disorder)    Asthma    Diverticulosis    Hypercholesteremia    Hypertension    Past Surgical History:  Procedure Laterality Date   COLONOSCOPY  2014   EYE SURGERY     LASIK    LAPAROSCOPIC APPENDECTOMY N/A 11/27/2017   Procedure: APPENDECTOMY LAPAROSCOPIC;  Surgeon: Coralie Keens, MD;  Location: [facility];  Service: General;  Laterality: N/A;   RETINAL DETACHMENT SURGERY Right 2015   Family History  Problem Relation Age of Onset   Hypertension Father     Social History   Tobacco Use   Smoking status: Never   Smokeless tobacco: Never  Substance Use Topics   Alcohol use: Yes    Alcohol/week: 2.0 standard drinks of alcohol    Types: 2 Glasses of wine per week   Marital Status: Married  ROS  Review of Systems  Constitutional: Negative for malaise/fatigue.  Cardiovascular:  Negative for chest pain,  claudication, dyspnea on exertion, irregular heartbeat, near-syncope, palpitations and syncope.  Respiratory:  Negative for shortness of breath.   Neurological:  Negative for dizziness and light-headedness.  All other systems reviewed and are negative.  Objective  Blood pressure (!) 175/94, pulse (!) 44, temperature 97.6 F (36.4 C), temperature source Temporal, resp. rate 16, height 5\' 11"  (1.803 m), weight 197 lb (89.4 kg), SpO2 100 %. Body mass index is 27.48 kg/m.     08/12/2022    2:05 PM 07/05/2022    3:40 PM 07/05/2022    3:30 PM  Vitals with BMI  Height 5\' 11"   5\' 11"   Weight 197 lbs  199 lbs 3 oz  BMI 66.59  93.5  Systolic 701 779 390  Diastolic 94 94 90  Pulse 44 39 40     Physical Exam Constitutional:      Appearance: Normal appearance.  HENT:     Head: Normocephalic and atraumatic.  Neck:     Vascular: No carotid bruit.  Cardiovascular:     Rate and Rhythm: Regular rhythm. Bradycardia present.     Pulses: Normal pulses.     Heart sounds: Normal heart sounds. No murmur heard.    No friction rub. No gallop.  Pulmonary:     Effort: Pulmonary effort is normal.     Breath sounds: Normal breath sounds.  Abdominal:     General: Abdomen is flat. Bowel sounds  are normal.     Palpations: Abdomen is soft.  Musculoskeletal:     Right lower leg: No edema.     Left lower leg: No edema.  Neurological:     Mental Status: He is alert.    Medications and allergies   Allergies  Allergen Reactions   Ibuprofen Anaphylaxis   Aspirin Rash     Medication list after today's encounter   Current Outpatient Medications:    acetaminophen (TYLENOL) 325 MG tablet, Take 650 mg by mouth every 6 (six) hours as needed for mild pain or moderate pain., Disp: , Rfl:    amLODipine (NORVASC) 5 MG tablet, Take 1 tablet (5 mg total) by mouth daily., Disp: 30 tablet, Rfl: 2   amphetamine-dextroamphetamine (ADDERALL) 10 MG tablet, Take 20 mg by mouth daily. , Disp: , Rfl:    fluticasone  (FLONASE) 50 MCG/ACT nasal spray, Place 2 sprays into both nostrils daily as needed., Disp: , Rfl:    loratadine (CLARITIN) 10 MG tablet, Take 10 mg by mouth daily., Disp: , Rfl:    losartan (COZAAR) 50 MG tablet, Take 1 tablet (50 mg total) by mouth at bedtime., Disp: 30 tablet, Rfl: 2   ondansetron (ZOFRAN) 4 MG tablet, Take 1 tablet (4 mg total) by mouth every 6 (six) hours., Disp: 20 tablet, Rfl: 0   oxyCODONE (OXY IR/ROXICODONE) 5 MG immediate release tablet, Take 1 tablet (5 mg total) by mouth every 4 (four) hours as needed for moderate pain., Disp: 12 tablet, Rfl: 0   rosuvastatin (CRESTOR) 20 MG tablet, Take 1 tablet (20 mg total) by mouth daily., Disp: 30 tablet, Rfl: 2   VENTOLIN HFA 108 (90 BASE) MCG/ACT inhaler, Inhale 2 puffs into the lungs every 4 (four) hours as needed., Disp: , Rfl:   Laboratory examination:   Lab Results  Component Value Date   NA 134 (L) 11/27/2017   K 4.1 11/27/2017   CO2 23 11/27/2017   GLUCOSE 99 11/27/2017   BUN 22 (H) 11/27/2017   CREATININE 1.18 11/27/2017   CALCIUM 9.6 11/27/2017   GFRNONAA >60 11/27/2017       Latest Ref Rng & Units 11/27/2017    1:00 PM 06/08/2014    4:42 PM 07/01/2010    8:45 AM  CMP  Glucose 65 - 99 mg/dL 99  283  85   BUN 6 - 20 mg/dL 22  18  27    Creatinine 0.61 - 1.24 mg/dL  6.62  1.2   Sodium 135 - 145 mmol/L 134  139  140   Potassium 3.5 - 5.1 mmol/L 4.1  3.5  3.9   Chloride 101 - 111 mmol/L 98  95  101   CO2 22 - 32 mmol/L 23  27  30    Calcium 8.9 - 10.3 mg/dL 9.6  9.8  9.5   Total Protein 6.5 - 8.1 g/dL 7.7  7.4  6.4   Total Bilirubin 0.3 - 1.2 mg/dL 1.6  0.8  0.9   Alkaline Phos 38 - 126 U/L 61  59  50   AST 15 - 41 U/L 57  23  29   ALT 17 - 63 U/L 70  28  31       Latest Ref Rng & Units 11/27/2017    1:00 PM 06/08/2014    4:42 PM 07/01/2010    8:45 AM  CBC  WBC 4.0 - 10.5 K/uL 13.5  9.2  6.8   Hemoglobin 13.0 - 17.0 g/dL 06/10/2014  14.3  13.7   Hematocrit 39.0 - 52.0 % 48.1  41.7  39.5   Platelets 150 -  400 K/uL 149  155  169.0     Lipid Panel No results for input(s): "CHOL", "TRIG", "LDLCALC", "VLDL", "HDL", "CHOLHDL", "LDLDIRECT" in the last 8760 hours.  HEMOGLOBIN A1C No results found for: "HGBA1C", "MPG" TSH No results for input(s): "TSH" in the last 8760 hours.  External labs:     Radiology:    Cardiac Studies:   Echocardiogram 08/05/2022: Normal LV systolic function with visual EF 55-60%. Left ventricle cavity is normal in size. Normal left ventricular wall thickness. Normal global wall motion. Normal diastolic filling pattern, normal LAP. Calculated EF 58%. Right ventricle cavity is mildly dilated. Normal right ventricular function. Structurally normal trileaflet aortic valve.  Trace aortic regurgitation. Structurally normal mitral valve.  Mild (Grade I) mitral regurgitation. Structurally normal tricuspid valve with no regurgitation. No evidence of pulmonary hypertension.  Lexiscan/modified Bruce nuclear stress test 07/27/2022: Lexiscan/modified Bruce nuclear stress test performed using 1-day protocol.  Patient walked for 13.3 METS and reached 75% MPHR. In addition, he was injected with 0.4 mg Lexiscan. Resting BP 180/100 mmHg, reached up to 210/90 mmHg post injection. Stress EKG showed sinus tachycardia, frequent PVC's, no significant ST-T abnormality.  Normal myocardial perfusion.  Normal wall motion and thickening. Stress LV EF calculated 48% possibly due to frequent PVC's, but visually appears normal.  Low risk study.  Long term event monitor 07/09/22: Patient had a min HR of 33 bpm, max HR of 144 bpm, and avg HR of 48 bpm. Predominant underlying rhythm was Sinus Rhythm. Slight P wave morphology changes were noted. 1 run of Supraventricular Tachycardia occurred lasting 4 beats with a max rate of 144  bpm (avg 138 bpm). Isolated SVEs were rare (<1.0%), SVE Couplets were rare (<1.0%), and SVE Triplets were rare (<1.0%). Isolated VEs were rare (<1.0%), VE Couplets were rare  (<1.0%), and no VE Triplets were present.   EKG:   07/05/22 EKG: marked sinus bradycardia, 38bpm    Assessment     ICD-10-CM   1. Primary hypertension  I10     2. Bradycardia  R00.1        No orders of the defined types were placed in this encounter.   No orders of the defined types were placed in this encounter.   There are no discontinued medications.    Recommendations:   Jimmy Santos is a 66 y.o. male with past medical history significant for bradycardia and HTN. Patient is here for 6 month follow-up.   Primary hypertension Blood pressure is elevated in office today. He does monitor his blood pressure at home and readings are 135-140/70. Continue current medications as ordered. Discussed continuing to monitor blood pressure ar home and keeping a log to bring to next visit. Continue exercise regimen.  Bradycardia His heart remains in the 40s at rest. Long term cardiac monitor results reassuring for no high grade blocks or sick sinus syndrome. Suspect that low heart rate is normal variant.  Follow-up in 6 months or sooner if needed.    Nori Riis, NP  08/12/2022, 2:41 PM Office: [phone] Pager: [phone]

## 2022-09-30 ENCOUNTER — Other Ambulatory Visit: Payer: Self-pay | Admitting: Internal Medicine

## 2023-01-06 ENCOUNTER — Other Ambulatory Visit: Payer: Self-pay | Admitting: Internal Medicine

## 2023-04-04 ENCOUNTER — Other Ambulatory Visit: Payer: Self-pay | Admitting: Internal Medicine

## 2023-04-05 ENCOUNTER — Other Ambulatory Visit: Payer: Self-pay | Admitting: Internal Medicine

## 2023-04-06 ENCOUNTER — Encounter: Payer: Self-pay | Admitting: Internal Medicine

## 2023-04-07 NOTE — Telephone Encounter (Signed)
Lets do losartan 50 mg twice daily so it doesn't all hit at once

## 2023-04-07 NOTE — Telephone Encounter (Signed)
From pt

## 2023-07-13 ENCOUNTER — Encounter: Payer: Self-pay | Admitting: Family Medicine

## 2023-07-13 ENCOUNTER — Other Ambulatory Visit: Payer: Self-pay | Admitting: Family Medicine

## 2023-07-13 DIAGNOSIS — I10 Essential (primary) hypertension: Secondary | ICD-10-CM

## 2023-07-20 ENCOUNTER — Encounter: Payer: Self-pay | Admitting: Cardiology

## 2023-07-22 NOTE — Progress Notes (Signed)
Called patient no answer, left a vm

## 2023-07-22 NOTE — Progress Notes (Signed)
Patient called back and was informed about his labs.

## 2023-08-02 ENCOUNTER — Ambulatory Visit: Payer: No Typology Code available for payment source | Admitting: Cardiology

## 2023-08-02 ENCOUNTER — Encounter: Payer: Self-pay | Admitting: Cardiology

## 2023-08-02 VITALS — BP 132/86 | HR 53 | Resp 16 | Ht 71.0 in | Wt 199.0 lb

## 2023-08-02 DIAGNOSIS — I10 Essential (primary) hypertension: Secondary | ICD-10-CM

## 2023-08-02 DIAGNOSIS — E782 Mixed hyperlipidemia: Secondary | ICD-10-CM

## 2023-08-02 DIAGNOSIS — R001 Bradycardia, unspecified: Secondary | ICD-10-CM

## 2023-08-02 MED ORDER — HYDRALAZINE HCL 25 MG PO TABS
25.0000 mg | ORAL_TABLET | Freq: Two times a day (BID) | ORAL | 0 refills | Status: DC
Start: 2023-08-02 — End: 2023-08-26

## 2023-08-02 NOTE — Progress Notes (Signed)
ID:  Jimmy Santos, DOB [birth year], MRN [id number]  PCP:  Catha Gosselin, MD  Cardiologist:  Tessa Lerner, DO, [facility] (established care 08/02/23) Former Cardiology Providers: Dr. Clotilde Dieter  Date: 08/02/23 Last Office Visit: 08/12/2022  Chief Complaint  Patient presents with   Hypertension   Follow-up    HPI  Jimmy Santos is a 67 y.o. Caucasian male whose  past medical history and cardiovascular risk factors include: Bradycardia, hypertension, hyperlipidemia, history of syncope.  Patient was initially referred to the practice for evaluation of bradycardia and hypertension.  He was under the care of my former partner Dr. Rozell Searing Custovic and I am seeing him for the first time at today's visit.  Blood pressure: Patient states that his blood pressure has been difficult to manage in the past.  He has been on quinapril for many years but then was later transitioned to other antihypertensive medications as quinapril was no longer available readily.  Currently takes all of his blood pressure medications in the morning which includes losartan 100 mg p.o. daily, hydrochlorothiazide 25 mg p.o. daily and is on hydralazine 10 mg p.o. every 6 hours.  Since last office visit with cardiology primary care has tried multiple antihypertensive medications which include but not limited to lisinopril, clonidine, minoxidil, amlodipine these medications have been tried and discontinued for various reasons.  Patient is scheduled tomorrow for abdominal ultrasound to evaluate for renal artery stenosis.  Patient has not been evaluated for sleep apnea and thinks he does not have it.  He drinks 2 alcoholic beverages on average each day on the weekends.  He had a plasma renin level checked with PCP in August 2024.  No aldosterone levels were checked at the same time.  And overall accuracy of the plasma renin levels is questionable as he was on both losartan and HCTZ.  He has an appointment with  hypertension clinic on 09/14/2023.  Home blood pressure log reviewed SBP still is >140 mmHg.  Patient states that his current [employer] blood pressure will not check a BP if the heart rate is around 40 bpm.  And therefore at times he exercises or paces vigorously to bring his heart rate up and then checks his blood pressure.  This may not be an accurate way of measuring his blood pressures at home.  FUNCTIONAL STATUS: Exercises 5 to 6 days a week-[employer] theory, free weights, plays tennis  CARDIAC DATABASE: EKG: 08/02/2023: Sinus bradycardia, 53 bpm, without underlying ischemia or injury pattern.  Echocardiogram: 08/05/2022: Normal LV systolic function with visual EF 55-60%. Left ventricle cavity is normal in size. Normal left ventricular wall thickness. Normal global wall motion. Normal diastolic filling pattern, normal LAP. Calculated EF 58%. Right ventricle cavity is mildly dilated. Normal right ventricular function.  Structurally normal trileaflet aortic valve.  Trace aortic regurgitation. Structurally normal mitral valve.  Mild (Grade I) mitral regurgitation. Structurally normal tricuspid valve with no regurgitation. No evidence of pulmonary hypertension.   Stress Testing: Lexiscan/modified Bruce nuclear stress test 07/27/2022: Lexiscan/modified Bruce nuclear stress test performed using 1-day protocol.  Patient walked for 13.3 METS and reached 75% MPHR. In addition, he was injected with 0.4 mg Lexiscan. Resting BP 180/100 mmHg, reached up to 210/90 mmHg post injection. Stress EKG showed sinus tachycardia, frequent PVC's, no significant ST-T abnormality. Normal myocardial perfusion. Normal wall motion and thickening. Stress LV EF calculated 48% possibly due to frequent PVC's, but visually appears normal. Low risk study.  Cardiac monitor (Zio Patch): August 2023 Dominant rhythm sinus, followed by  bradycardia (74% burden). Heart rate 33-121 bpm.  Avg HR 48 bpm. No atrial fibrillation  detected during the monitoring period. No ventricular tachycardia, high grade AV block, pauses (3 seconds or longer). Total supraventricular ectopic burden <1%. Total ventricular ectopic burden <1%. Patient triggered events: 0.      ALLERGIES: Allergies  Allergen Reactions   Ibuprofen Anaphylaxis   Lisinopril Other (See Comments)    Breathing difficulties and swelling in the throat.   Aspirin Rash    MEDICATION LIST PRIOR TO VISIT: Current Meds  Medication Sig   amphetamine-dextroamphetamine (ADDERALL) 10 MG tablet Take 20 mg by mouth daily.    fluticasone (FLONASE) 50 MCG/ACT nasal spray Place 2 sprays into both nostrils daily as needed.   hydrALAZINE (APRESOLINE) 25 MG tablet Take 1 tablet (25 mg total) by mouth 2 (two) times daily for 30 doses.   hydrochlorothiazide (HYDRODIURIL) 25 MG tablet Take 25 mg by mouth every morning.   loratadine (CLARITIN) 10 MG tablet Take 10 mg by mouth daily.   losartan (COZAAR) 100 MG tablet Take 100 mg by mouth every evening.   rosuvastatin (CRESTOR) 10 MG tablet Take 10 mg by mouth daily.   [DISCONTINUED] hydrALAZINE (APRESOLINE) 10 MG tablet Take 10 mg by mouth 4 (four) times daily.     PAST MEDICAL HISTORY: Past Medical History:  Diagnosis Date   ADD (attention deficit disorder)    Diverticulosis    Hypercholesteremia    Hypertension     PAST SURGICAL HISTORY: Past Surgical History:  Procedure Laterality Date   COLONOSCOPY  2014   EYE SURGERY     LASIK    LAPAROSCOPIC APPENDECTOMY N/A 11/27/2017   Procedure: APPENDECTOMY LAPAROSCOPIC;  Surgeon: Abigail Miyamoto, MD;  Location: [facility];  Service: General;  Laterality: N/A;   RETINAL DETACHMENT SURGERY Right 2015    FAMILY HISTORY: The patient family history includes Hypertension in his father.  SOCIAL HISTORY:  The patient  reports that he has never smoked. He has never used smokeless tobacco. He reports current alcohol use of about 2.0 standard drinks of alcohol per week. He  reports that he does not use drugs.  REVIEW OF SYSTEMS: Review of Systems  Cardiovascular:  Negative for chest pain, claudication, dyspnea on exertion, irregular heartbeat, leg swelling, near-syncope, orthopnea, palpitations, paroxysmal nocturnal dyspnea and syncope.  Respiratory:  Negative for shortness of breath.   Hematologic/Lymphatic: Negative for bleeding problem.  Musculoskeletal:  Negative for muscle cramps and myalgias.  Neurological:  Positive for dizziness (Intermittently with low blood pressures.) and light-headedness (Intermittently with low blood pressures.).    PHYSICAL EXAM:    08/02/2023    3:55 PM 08/02/2023    3:09 PM 08/12/2022    2:05 PM  Vitals with BMI  Height  5\' 11"  5\' 11"   Weight  199 lbs 197 lbs  BMI  27.77 27.49  Systolic 132 168 657  Diastolic 86 91 94  Pulse  53 44    Physical Exam  Constitutional: No distress.  Age appropriate, hemodynamically stable.   Neck: No JVD present.  Cardiovascular: Normal rate, regular rhythm, S1 normal, S2 normal, intact distal pulses and normal pulses. Exam reveals no gallop, no S3 and no S4.  No murmur heard. Pulmonary/Chest: Effort normal and breath sounds normal. No stridor. He has no wheezes. He has no rales.  Abdominal: Soft. Bowel sounds are normal. He exhibits no distension. There is no abdominal tenderness.  Musculoskeletal:        General: No edema.  Cervical back: Neck supple.  Neurological: He is alert and oriented to person, place, and time. He has intact cranial nerves (2-12).  Skin: Skin is warm and moist.     LABORATORY DATA:    Latest Ref Rng & Units 11/27/2017    1:00 PM 06/08/2014    4:42 PM 07/01/2010    8:45 AM  CBC  WBC 4.0 - 10.5 K/uL 13.5  9.2  6.8   Hemoglobin 13.0 - 17.0 g/dL 95.6  21.3  08.6   Hematocrit 39.0 - 52.0 % 48.1  41.7  39.5   Platelets 150 - 400 K/uL 149  155  169.0        Latest Ref Rng & Units 11/27/2017    1:00 PM 06/08/2014    4:42 PM 07/01/2010    8:45 AM  CMP   Glucose 65 - 99 mg/dL 99  578  85   BUN 6 - 20 mg/dL 22  18  27    Creatinine 0.61 - 1.24 mg/dL 4.69  6.29  1.2   Sodium 135 - 145 mmol/L 134  139  140   Potassium 3.5 - 5.1 mmol/L 4.1  3.5  3.9   Chloride 101 - 111 mmol/L 98  95  101   CO2 22 - 32 mmol/L 23  27  30    Calcium 8.9 - 10.3 mg/dL 9.6  9.8  9.5   Total Protein 6.5 - 8.1 g/dL 7.7  7.4  6.4   Total Bilirubin 0.3 - 1.2 mg/dL 1.6  0.8  0.9   Alkaline Phos 38 - 126 U/L 61  59  50   AST 15 - 41 U/L 57  23  29   ALT 17 - 63 U/L 70  28  31     Lab Results  Component Value Date   CHOL 137 07/01/2010   HDL 35.90 (L) 07/01/2010   LDLCALC 64 07/01/2010   TRIG 186.0 (H) 07/01/2010   CHOLHDL 4 07/01/2010   No components found for: "NTPROBNP" No results for input(s): "PROBNP" in the last 8760 hours. No results for input(s): "TSH" in the last 8760 hours.  BMP No results for input(s): "NA", "K", "CL", "CO2", "GLUCOSE", "BUN", "CREATININE", "CALCIUM", "GFRNONAA", "GFRAA" in the last 8760 hours.  HEMOGLOBIN A1C No results found for: "HGBA1C", "MPG"  IMPRESSION:    ICD-10-CM   1. Primary hypertension  I10 EKG 12-Lead    hydrALAZINE (APRESOLINE) 25 MG tablet    2. Bradycardia  R00.1        RECOMMENDATIONS: Doss Mccormack is a 67 y.o. Caucasian male whose past medical history and cardiac risk factors include: Bradycardia, hypertension, hyperlipidemia, history of syncope.  Primary hypertension Has had labile blood pressure readings in ambulatory setting.  Given his bradycardia sometimes his home monitor does not register a BP reading and therefore he either walks or exercises to bring the heart rate up and then checks his blood pressure.  Though he is able to register a BP it is often a high reading due to him being physically active.  I have asked him to sit for about 10 minutes with 2 feet on the floor and arm rested and check his blood pressure readings. Reemphasized importance of low-salt diet Currently takes ALL of his  blood pressure medications in the morning. Advised him to take hydrochlorothiazide 25 mg p.o. every morning and losartan 100 mg p.o. every afternoon. Discontinue hydralazine 10 mg p.o. every 6 hours.   Will transition to hydralazine 25 mg p.o. twice  daily for the first 2 weeks.  If he is not having episodes of lightheadedness or dizziness and SBP is still greater than 130 mmHg have advised him to take hydralazine 25 mg p.o. 3 times daily. Plasma renin levels back in August 2024 were checked by PCP overall diagnostic yield is low as he was on losartan and hydrochlorothiazide and no aldosterone levels were checked. Renal duplex is scheduled for tomorrow Recommended sleep study - patient thinks he does not have it.  In the past he has not done well with clonidine, minoxidil, lisinopril, amlodipine. AV nodal blocking agents have not been favorable due to baseline bradycardia. He has an appointment with Dr. Duke Salvia in October 2024. I have asked him to keep it and to get a different perspective on his case as I am also seeing him for the first time.   Bradycardia Asymptomatic. Has undergone a Zio patch in the past. Monitor for now  Mixed hyperlipidemia Currently on rosuvastatin. No recent lipid profile available for review. Currently managed by primary care provider.  Greater than 40 minutes spent during today's encounter to review prior records and test results, review his blood pressure log, medication reconciliation, and change in medical therapy.  FINAL MEDICATION LIST END OF ENCOUNTER: Meds ordered this encounter  Medications   hydrALAZINE (APRESOLINE) 25 MG tablet    Sig: Take 1 tablet (25 mg total) by mouth 2 (two) times daily for 30 doses.    Dispense:  60 tablet    Refill:  0     Current Outpatient Medications:    amphetamine-dextroamphetamine (ADDERALL) 10 MG tablet, Take 20 mg by mouth daily. , Disp: , Rfl:    fluticasone (FLONASE) 50 MCG/ACT nasal spray, Place 2 sprays into  both nostrils daily as needed., Disp: , Rfl:    hydrALAZINE (APRESOLINE) 25 MG tablet, Take 1 tablet (25 mg total) by mouth 2 (two) times daily for 30 doses., Disp: 60 tablet, Rfl: 0   hydrochlorothiazide (HYDRODIURIL) 25 MG tablet, Take 25 mg by mouth every morning., Disp: , Rfl:    loratadine (CLARITIN) 10 MG tablet, Take 10 mg by mouth daily., Disp: , Rfl:    losartan (COZAAR) 100 MG tablet, Take 100 mg by mouth every evening., Disp: , Rfl:    rosuvastatin (CRESTOR) 10 MG tablet, Take 10 mg by mouth daily., Disp: , Rfl:   Orders Placed This Encounter  Procedures   EKG 12-Lead    There are no Patient Instructions on file for this visit.   --Continue cardiac medications as reconciled in final medication list. --Return in about 7 weeks (around 09/20/2023) for Follow up, BP. or sooner if needed. --Continue follow-up with your primary care physician regarding the management of your other chronic comorbid conditions.  Patient's questions and concerns were addressed to his satisfaction. He voices understanding of the instructions provided during this encounter.   This note was created using a voice recognition software as a result there may be grammatical errors inadvertently enclosed that do not reflect the nature of this encounter. Every attempt is made to correct such errors.  Tessa Lerner, [place], [facility]  Pager:  [phone] Office: [phone]

## 2023-08-03 ENCOUNTER — Ambulatory Visit
Admission: RE | Admit: 2023-08-03 | Discharge: 2023-08-03 | Disposition: A | Discharge status: Home / Self Care | Payer: No Typology Code available for payment source | Source: Ambulatory Visit | Attending: Family Medicine | Admitting: Family Medicine

## 2023-08-03 DIAGNOSIS — I10 Essential (primary) hypertension: Secondary | ICD-10-CM

## 2023-08-26 ENCOUNTER — Other Ambulatory Visit: Payer: Self-pay | Admitting: Cardiology

## 2023-08-26 DIAGNOSIS — I10 Essential (primary) hypertension: Secondary | ICD-10-CM

## 2023-09-09 ENCOUNTER — Encounter: Payer: Self-pay | Admitting: Cardiology

## 2023-09-12 NOTE — Telephone Encounter (Signed)
Thanks for the update. Glad to help.   Daegen Berrocal [place], DO, [facility]

## 2023-09-14 ENCOUNTER — Institutional Professional Consult (permissible substitution) (HOSPITAL_BASED_OUTPATIENT_CLINIC_OR_DEPARTMENT_OTHER): Payer: No Typology Code available for payment source | Admitting: Cardiovascular Disease

## 2023-09-20 ENCOUNTER — Ambulatory Visit: Payer: No Typology Code available for payment source | Attending: Cardiology | Admitting: Cardiology

## 2023-09-20 ENCOUNTER — Encounter: Payer: Self-pay | Admitting: Cardiology

## 2023-09-20 VITALS — BP 140/88 | HR 88 | Resp 16 | Ht 71.0 in | Wt 199.2 lb

## 2023-09-20 DIAGNOSIS — E782 Mixed hyperlipidemia: Secondary | ICD-10-CM

## 2023-09-20 DIAGNOSIS — I10 Essential (primary) hypertension: Secondary | ICD-10-CM | POA: Diagnosis not present

## 2023-09-20 DIAGNOSIS — R001 Bradycardia, unspecified: Secondary | ICD-10-CM | POA: Diagnosis not present

## 2023-09-20 MED ORDER — HYDRALAZINE HCL 25 MG PO TABS: 25.0000 mg | ORAL_TABLET | Freq: Three times a day (TID) | ORAL | Status: AC

## 2023-09-20 NOTE — Patient Instructions (Signed)
Medication Instructions:  Your physician has recommended you make the following change in your medication:   Start taking your Hydralazine 25 mg 3 times daily. If this seems to work for you, call our office and let us know and we will update your prescription.    *If you need a refill on your cardiac medications before your next appointment, please call your pharmacy*  Lab Work: None ordered today. If you have labs (blood work) drawn today and your tests are completely normal, you will receive your results only by: MyChart Message (if you have MyChart) OR A paper copy in the mail If you have any lab test that is abnormal or we need to change your treatment, we will call you to review the results.  Testing/Procedures: None ordered today.  Follow-Up: At [facility], you and your health needs are our priority.  As part of our continuing mission to provide you with exceptional heart care, we have created designated Provider Care Teams.  These Care Teams include your primary Cardiologist (physician) and Advanced Practice Providers (APPs -  Physician Assistants and Nurse Practitioners) who all work together to provide you with the care you need, when you need it.  Your next appointment:   1 year(s)  The format for your next appointment:   In Person  Provider:   Tessa Lerner, DO {

## 2023-09-20 NOTE — Progress Notes (Signed)
Cardiology Office Note:  .   Date:  09/20/2023  ID:  Jimmy Santos, DOB [birth year], MRN [id number] PCP:  Soundra Pilon, FNP  Former Cardiology Providers: Dr. Clotilde Dieter [facility] Providers Cardiologist:  Tessa Lerner, DO , [facility] (established care 07/2023) Electrophysiologist:  None  Click to update primary MD,subspecialty MD or APP then REFRESH:1}    Chief Complaint  Patient presents with   Hypertension   Follow-up    History of Present Illness: .   Jimmy Santos is a 67 y.o. Caucasian male whose past medical history and cardiovascular risk factors includes: Bradycardia, hypertension, hyperlipidemia, history of syncope.   Patient was referred to the practice for evaluation of bradycardia and hypertension.  In the past he is to take all of his blood pressure medications in the morning.  In the interim blood pressure medications were also titrated by PCP and he was prescribed medications such as lisinopril, clonidine, minoxidil, amlodipine and all these medications were discontinued for various reasons.  At the last office visit blood pressures were not well-controlled.  He was advised to take HCTZ in the morning and losartan at night.  Hydralazine was increased from 10 mg p.o. every 6 hours to 25 mg p.o. twice daily.  Patient presents today for follow-up.  Since last office visit patient states that his blood pressures have consistently improved and is very happy with the progress.  Home blood pressure log reviewed on his phone and SBP ranges between 130 mmHg on current medical therapy.  He was given a follow-up with hypertension clinic with Dr. Duke Salvia but chose not to as his blood pressures were improving.  His renal duplex is still pending.  Review of Systems: .   Review of Systems  Cardiovascular:  Negative for chest pain, claudication, irregular heartbeat, leg swelling, near-syncope, orthopnea, palpitations, paroxysmal nocturnal dyspnea and syncope.   Respiratory:  Negative for shortness of breath.   Hematologic/Lymphatic: Negative for bleeding problem.    Studies Reviewed:   EKG: 08/02/2023: Sinus bradycardia, 53 bpm, without underlying ischemia or injury pattern   Echocardiogram: July 2023: LVEF 55-60%, normal diastolic function, mild MR, see report for additional details  Stress Testing: Lexiscan/modified Bruce nuclear stress test 07/27/2022: Low risk study, see report for additional details  Cardiac monitor: August 2023 Dominant rhythm sinus, followed by bradycardia (74% burden). Heart rate 33-121 bpm.  Avg HR 48 bpm. No atrial fibrillation detected during the monitoring period. No ventricular tachycardia, high grade AV block, pauses (3 seconds or longer). Total supraventricular ectopic burden <1%. Total ventricular ectopic burden <1%. Patient triggered events: 0.    RADIOLOGY: NA  Risk Assessment/Calculations:   NA   Labs:       Latest Ref Rng & Units 11/27/2017    1:00 PM 06/08/2014    4:42 PM 07/01/2010    8:45 AM  CBC  WBC 4.0 - 10.5 K/uL 13.5  9.2  6.8   Hemoglobin 13.0 - 17.0 g/dL 40.3  47.4  25.9   Hematocrit 39.0 - 52.0 % 48.1  41.7  39.5   Platelets 150 - 400 K/uL 149  155  169.0        Latest Ref Rng & Units 11/27/2017    1:00 PM 06/08/2014    4:42 PM 07/01/2010    8:45 AM  BMP  Glucose 65 - 99 mg/dL 99  563  85   BUN 6 - 20 mg/dL 22  18  27    Creatinine 0.61 - 1.24 mg/dL 8.75  6.43  1.2   Sodium 135 - 145 mmol/L 134  139  140   Potassium 3.5 - 5.1 mmol/L 4.1  3.5  3.9   Chloride 101 - 111 mmol/L 98  95  101   CO2 22 - 32 mmol/L 23  27  30    Calcium 8.9 - 10.3 mg/dL 9.6  9.8  9.5       Latest Ref Rng & Units 11/27/2017    1:00 PM 06/08/2014    4:42 PM 07/01/2010    8:45 AM  CMP  Glucose 65 - 99 mg/dL 99  562  85   BUN 6 - 20 mg/dL 22  18  27    Creatinine 0.61 - 1.24 mg/dL 1.30  8.65  1.2   Sodium 135 - 145 mmol/L 134  139  140   Potassium 3.5 - 5.1 mmol/L 4.1  3.5  3.9   Chloride 101 - 111  mmol/L 98  95  101   CO2 22 - 32 mmol/L 23  27  30    Calcium 8.9 - 10.3 mg/dL 9.6  9.8  9.5   Total Protein 6.5 - 8.1 g/dL 7.7  7.4  6.4   Total Bilirubin 0.3 - 1.2 mg/dL 1.6  0.8  0.9   Alkaline Phos 38 - 126 U/L 61  59  50   AST 15 - 41 U/L 57  23  29   ALT 17 - 63 U/L 70  28  31     Lab Results  Component Value Date   CHOL 137 07/01/2010   HDL 35.90 (L) 07/01/2010   LDLCALC 64 07/01/2010   TRIG 186.0 (H) 07/01/2010   CHOLHDL 4 07/01/2010   No results for input(s): "LIPOA" in the last 8760 hours. No components found for: "NTPROBNP" No results for input(s): "PROBNP" in the last 8760 hours. No results for input(s): "TSH" in the last 8760 hours.  External Labs: Collected: June 2024 [facility] database. Total cholesterol 152, triglycerides 170, HDL 58, LDL calculated 65. BUN 25.  Physical Exam:    Today's Vitals   09/20/23 1324  BP: (!) 140/88  Pulse: 88  Resp: 16  SpO2: 97%  Weight: 199 lb 3.2 oz (90.4 kg)  Height: 5\' 11"  (1.803 m)   Body mass index is 27.78 kg/m. Wt Readings from Last 3 Encounters:  09/20/23 199 lb 3.2 oz (90.4 kg)  08/02/23 199 lb (90.3 kg)  08/12/22 197 lb (89.4 kg)    Physical Exam  Constitutional: No distress.  Age appropriate, hemodynamically stable.   Neck: No JVD present.  Cardiovascular: Normal rate, regular rhythm, S1 normal, S2 normal, intact distal pulses and normal pulses. Exam reveals no gallop, no S3 and no S4.  No murmur heard. Pulmonary/Chest: Effort normal and breath sounds normal. No stridor. He has no wheezes. He has no rales.  Abdominal: Soft. Bowel sounds are normal. He exhibits no distension. There is no abdominal tenderness.  Musculoskeletal:        General: No edema.     Cervical back: Neck supple.  Neurological: He is alert and oriented to person, place, and time. He has intact cranial nerves (2-12).  Skin: Skin is warm and moist.   Impression & Recommendation(s):  Impression:   ICD-10-CM   1. Primary hypertension  I10  hydrALAZINE (APRESOLINE) 25 MG tablet    2. Bradycardia  R00.1     3. Mixed hyperlipidemia  E78.2        Recommendation(s):  Primary hypertension Chronic condition Office blood pressures  have improved compared to the last office visit. Home blood pressure readings have also improved on current medical therapy. Shared decision was to increase hydralazine from 25 mg p.o. twice daily to 3 times daily dosing Reemphasized importance of low-salt diet. Patient chooses not to follow-up with hypertension clinic as he has responded well to the changes made at the last visit Renal duplex is pending-ordered prior to establishing care with myself Monitor for now  Bradycardia Chronic. Asymptomatic. Has undergone appropriate workup as outlined above including echo and stress test  Mixed hyperlipidemia Currently on rosuvastatin. Outside labs independently reviewed, LDL 65 mg/dL as of June 1478. Does not endorse myalgias. Triglyceride levels are not well-controlled, 170 mg/dL as of June 2956.  Advised him to reduce foods that are high in triglyceride levels and to follow-up with PCP.   Discussed management of at least 2 chronic comorbid conditions, medication changes as discussed above, reviewed outside labs from [facility] database, discussed lifestyle changes to help improve chronic comorbid conditions.  Orders Placed:  No orders of the defined types were placed in this encounter.  Final Medication List:    Meds ordered this encounter  Medications   hydrALAZINE (APRESOLINE) 25 MG tablet    Sig: Take 1 tablet (25 mg total) by mouth 3 (three) times daily.    Medications Discontinued During This Encounter  Medication Reason   hydrALAZINE (APRESOLINE) 25 MG tablet      Current Outpatient Medications:    amphetamine-dextroamphetamine (ADDERALL) 10 MG tablet, Take 20 mg by mouth daily. , Disp: , Rfl:    fluticasone (FLONASE) 50 MCG/ACT nasal spray, Place 2 sprays into both nostrils daily as  needed., Disp: , Rfl:    hydrochlorothiazide (HYDRODIURIL) 25 MG tablet, Take 25 mg by mouth every morning., Disp: , Rfl:    loratadine (CLARITIN) 10 MG tablet, Take 10 mg by mouth daily., Disp: , Rfl:    losartan (COZAAR) 100 MG tablet, Take 100 mg by mouth every evening., Disp: , Rfl:    rosuvastatin (CRESTOR) 10 MG tablet, Take 10 mg by mouth daily., Disp: , Rfl:    hydrALAZINE (APRESOLINE) 25 MG tablet, Take 1 tablet (25 mg total) by mouth 3 (three) times daily., Disp: , Rfl:   Consent:   N/A  Disposition:   1 year sooner if needed Patient may be asked to follow-up sooner based on the results of the above-mentioned testing.  His questions and concerns were addressed to his satisfaction. He voices understanding of the recommendations provided during this encounter.    Signed, Tessa Lerner, DO, [facility]  [facility]  [address] [place], [place] [address] 09/20/2023 6:28 PM
